# Patient Record
Sex: Female | Born: 1950
Health system: Southern US, Community
[De-identification: ages and names within clinical notes are randomized; demographics above are authoritative.]

## PROBLEM LIST (undated history)

## (undated) DIAGNOSIS — R030 Elevated blood-pressure reading, without diagnosis of hypertension: Secondary | ICD-10-CM

## (undated) DIAGNOSIS — K635 Polyp of colon: Secondary | ICD-10-CM

## (undated) DIAGNOSIS — D229 Melanocytic nevi, unspecified: Secondary | ICD-10-CM

## (undated) DIAGNOSIS — IMO0001 Reserved for inherently not codable concepts without codable children: Secondary | ICD-10-CM

## (undated) HISTORY — DX: Melanocytic nevi, unspecified: D22.9

## (undated) HISTORY — DX: Elevated blood-pressure reading, without diagnosis of hypertension: R03.0

## (undated) HISTORY — PX: TOTAL ABDOMINAL HYSTERECTOMY: SHX209

## (undated) HISTORY — DX: Reserved for inherently not codable concepts without codable children: IMO0001

## (undated) HISTORY — DX: Polyp of colon: K63.5

---

## 2003-02-16 HISTORY — PX: COLONOSCOPY: SHX174

## 2007-06-06 ENCOUNTER — Ambulatory Visit: Payer: Self-pay | Admitting: Family Medicine

## 2007-06-06 DIAGNOSIS — R4589 Other symptoms and signs involving emotional state: Secondary | ICD-10-CM | POA: Insufficient documentation

## 2007-06-07 LAB — CONVERTED CEMR LAB
Alkaline Phosphatase: 57 units/L (ref 39–117)
Basophils Absolute: 0 10*3/uL (ref 0.0–0.1)
Basophils Relative: 0.1 % (ref 0.0–1.0)
Bilirubin, Direct: 0.1 mg/dL (ref 0.0–0.3)
CO2: 31 meq/L (ref 19–32)
Eosinophils Relative: 2 % (ref 0.0–5.0)
GFR calc Af Amer: 112 mL/min
Glucose, Bld: 77 mg/dL (ref 70–99)
LDL Cholesterol: 102 mg/dL — ABNORMAL HIGH (ref 0–99)
Lymphocytes Relative: 20.5 % (ref 12.0–46.0)
Neutrophils Relative %: 72.9 % (ref 43.0–77.0)
Potassium: 4.1 meq/L (ref 3.5–5.1)
RBC: 4.94 M/uL (ref 3.87–5.11)
Sodium: 140 meq/L (ref 135–145)
Total Bilirubin: 0.7 mg/dL (ref 0.3–1.2)
Total Protein: 7.3 g/dL (ref 6.0–8.3)
VLDL: 11 mg/dL (ref 0–40)
WBC: 9.3 10*3/uL (ref 4.5–10.5)

## 2007-06-12 ENCOUNTER — Ambulatory Visit: Payer: Self-pay | Admitting: Family Medicine

## 2007-06-12 LAB — HM COLONOSCOPY

## 2007-06-27 ENCOUNTER — Encounter: Payer: Self-pay | Admitting: Family Medicine

## 2008-08-07 ENCOUNTER — Ambulatory Visit (HOSPITAL_COMMUNITY): Admission: RE | Admit: 2008-08-07 | Discharge: 2008-08-07 | Payer: Self-pay | Admitting: Family Medicine

## 2008-08-07 IMAGING — CT CT PELVIS W/O CM
2 of 4 series · 17 of 46 positions shown, 19 images · non-contrast
Comparison: None

CT ABDOMEN

CLINICAL DATA: Left lower quadrant pain

CT OF THE ABDOMEN AND PELVIS WITHOUT CONTRAST (CT UROGRAM)
TECHNIQUE: Multidetector CT imaging was performed through the
abdomen and pelvis to include the urinary tract.

[Series 2: <(id) stone a/p >(id) · axial · 0.70mm/px · z∈[-438,-33]mm · 14 of 88 slices shown, 16 images]
[im 4/88  soft-tissue]
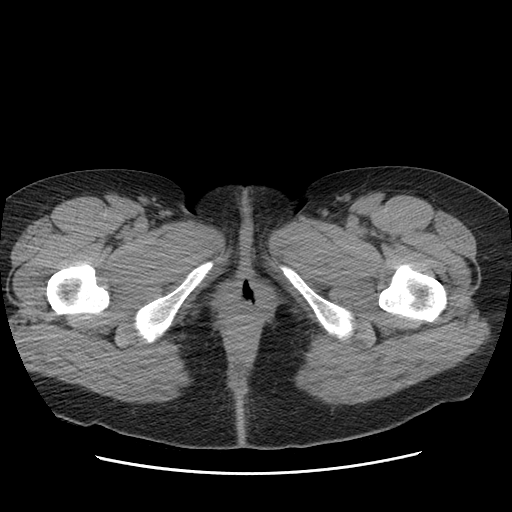
[im 4/88  bone]
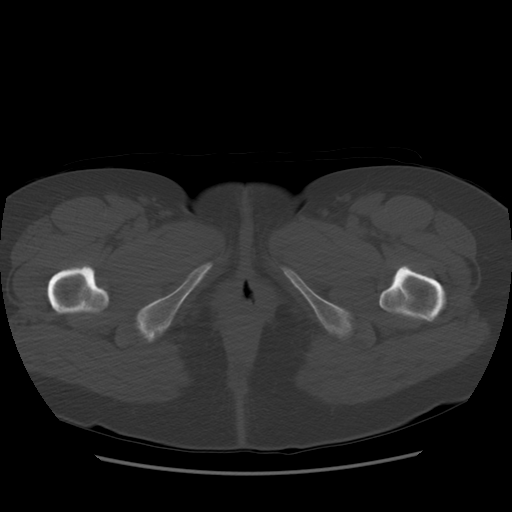
[im 11/88  soft-tissue]
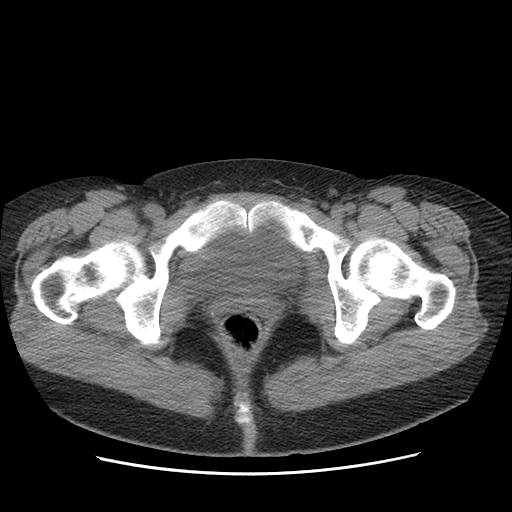
[im 18/88  soft-tissue]
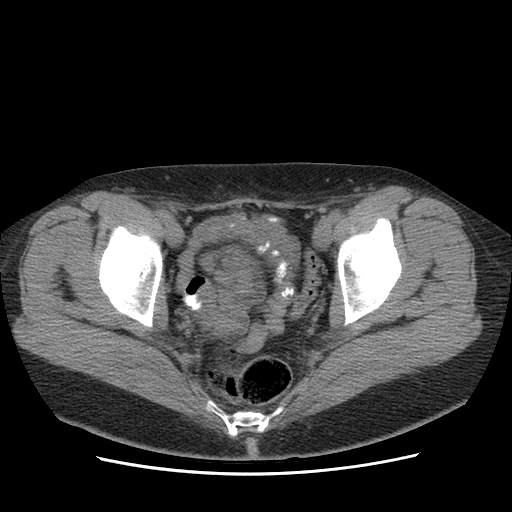
[im 25/88  soft-tissue]
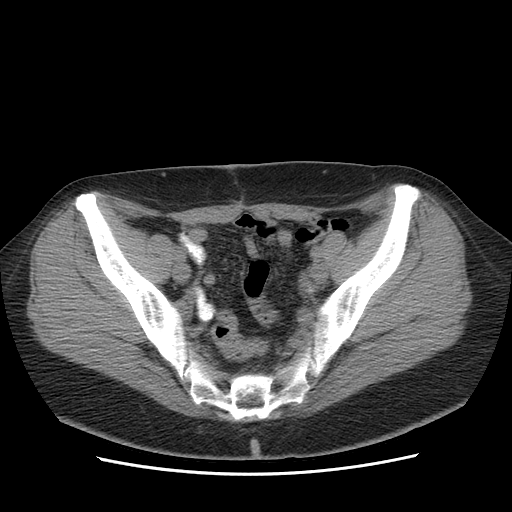
[im 28/88  soft-tissue]
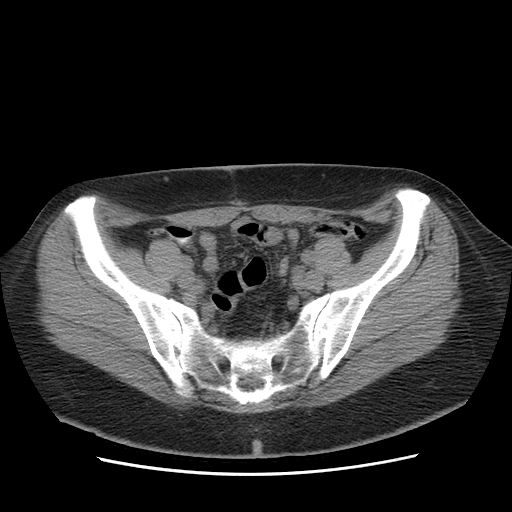
[im 35/88  soft-tissue]
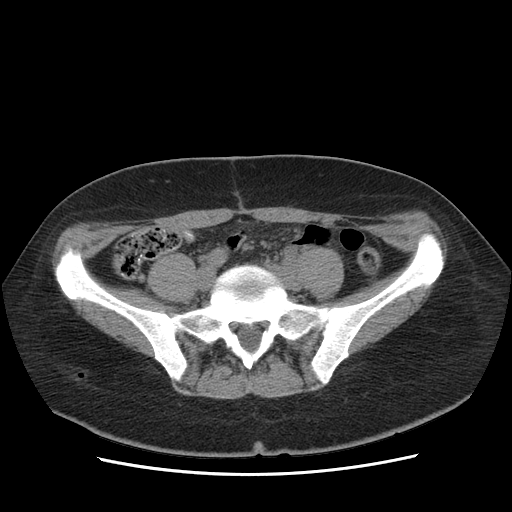
[im 42/88  soft-tissue]
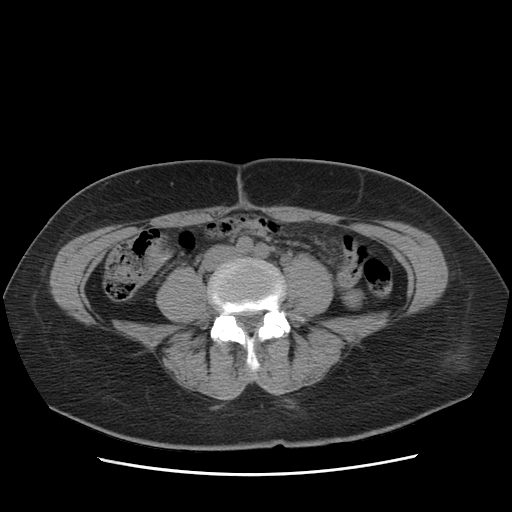
[im 46/88  soft-tissue]
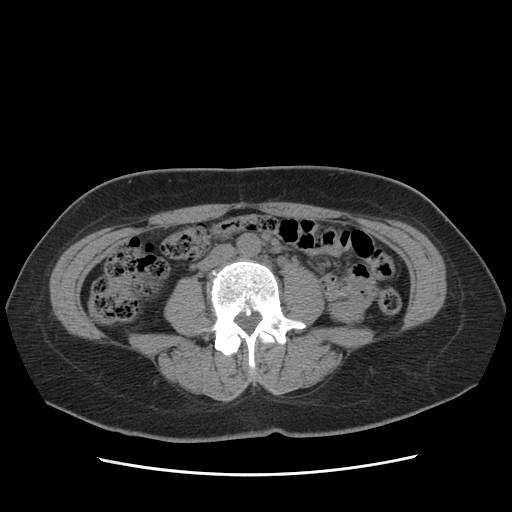
[im 53/88  soft-tissue]
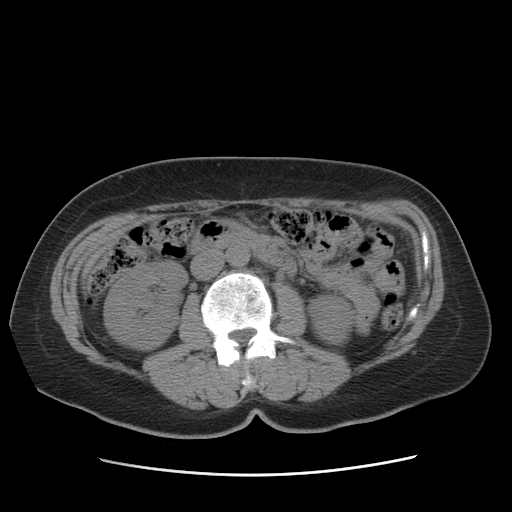
[im 53/88  bone]
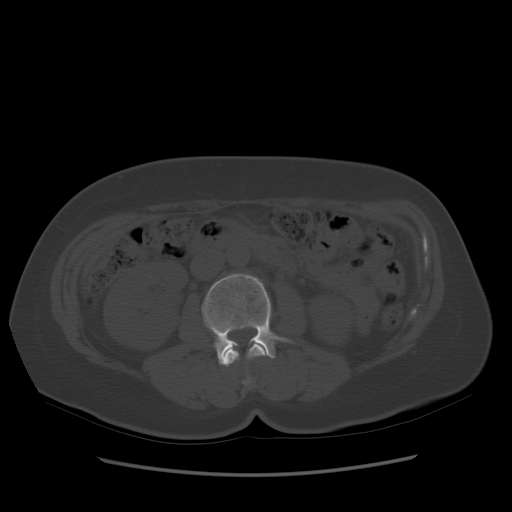
[im 60/88  soft-tissue]
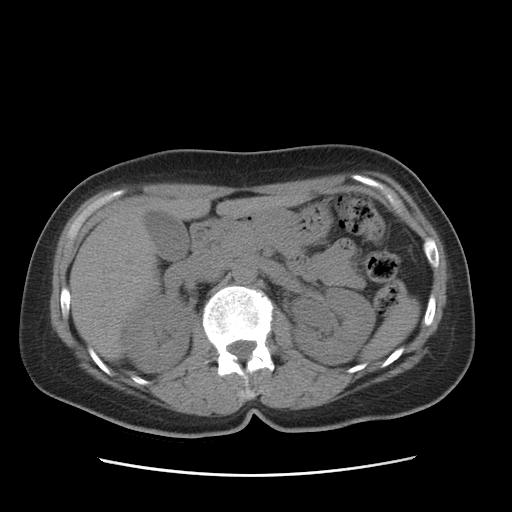
[im 67/88  soft-tissue]
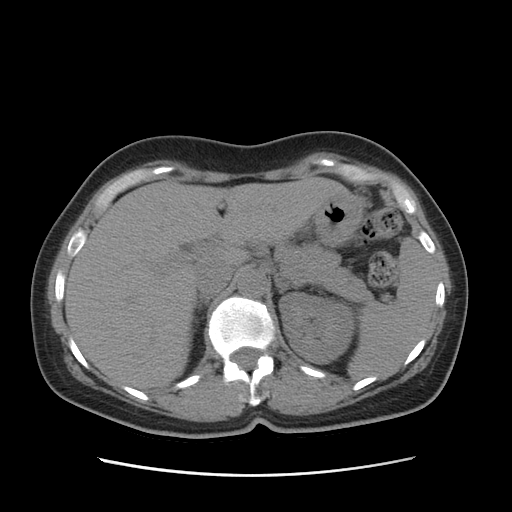
[im 70/88  soft-tissue]
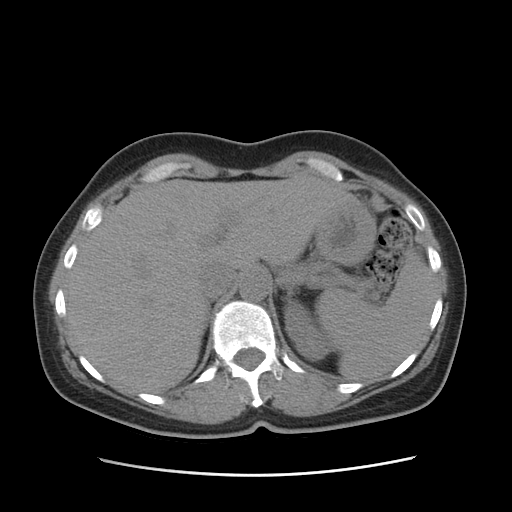
[im 77/88  soft-tissue]
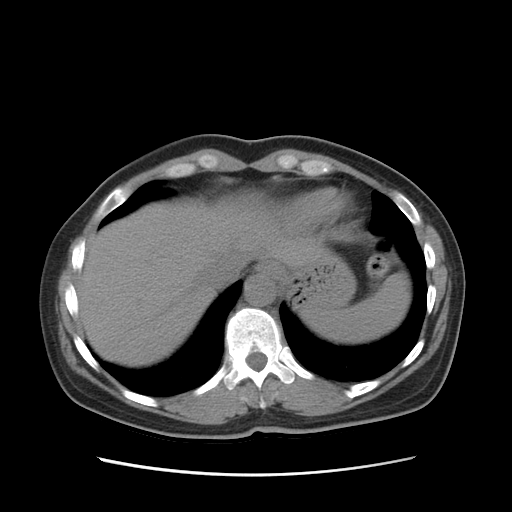
[im 84/88  soft-tissue]
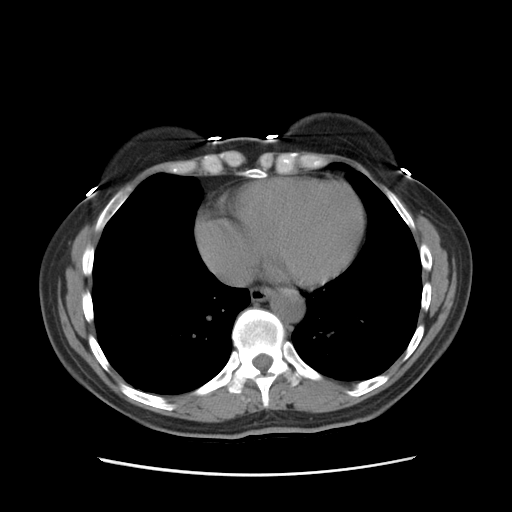

[Series 401: cor · coronal · 0.89mm/px · 3 of 69 slices shown]
[im 23/69  soft-tissue]
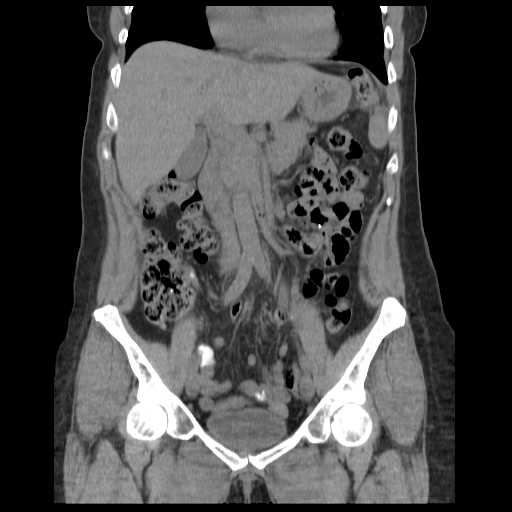
[im 31/69  soft-tissue]
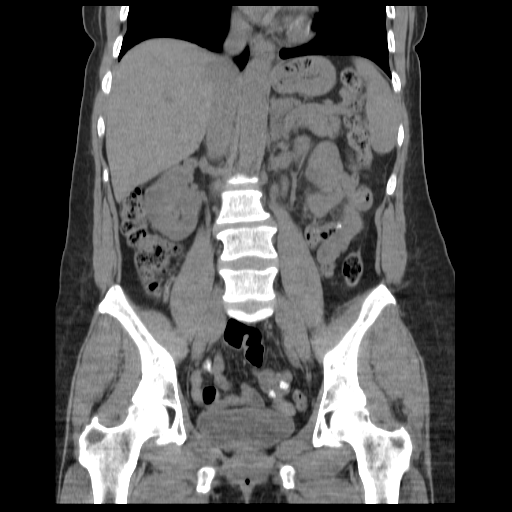
[im 38/69  soft-tissue]
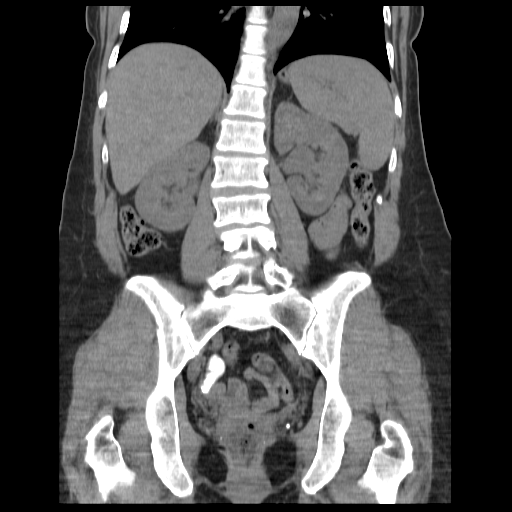

[17 of 46 positions shown; findings below may reference images not displayed]

FINDINGS: Mild left hydronephrosis with minimal left perinephric
stranding.  No nephrolithiasis.  Right kidney is within normal
limits.  Liver, gallbladder, spleen, pancreas, adrenal glands are
grossly within normal limits.
IMPRESSION: Left hydronephrosis.

CT PELVIS
FINDINGS: Distal left ureteral calculus measuring 8 mm in diameter
is 5 cm from the left ureteral vesicle junction.  Bladder is within
normal limits.  No right ureteral calculus.  No free fluid.
Postoperative changes.
IMPRESSION: 8 mm distal left ureteral calculus.

## 2008-08-08 ENCOUNTER — Encounter: Payer: Self-pay | Admitting: Family Medicine

## 2008-08-12 ENCOUNTER — Ambulatory Visit (HOSPITAL_BASED_OUTPATIENT_CLINIC_OR_DEPARTMENT_OTHER): Admission: RE | Admit: 2008-08-12 | Discharge: 2008-08-12 | Payer: Self-pay | Admitting: Urology

## 2008-09-23 ENCOUNTER — Ambulatory Visit: Payer: Self-pay | Admitting: Family Medicine

## 2008-09-25 LAB — CONVERTED CEMR LAB
ALT: 15 units/L (ref 0–35)
Alkaline Phosphatase: 63 units/L (ref 39–117)
BUN: 13 mg/dL (ref 6–23)
Basophils Relative: 1.1 % (ref 0.0–3.0)
Bilirubin, Direct: 0.1 mg/dL (ref 0.0–0.3)
Calcium: 9.1 mg/dL (ref 8.4–10.5)
Cholesterol: 146 mg/dL (ref 0–200)
Creatinine, Ser: 0.8 mg/dL (ref 0.4–1.2)
Eosinophils Relative: 2.7 % (ref 0.0–5.0)
HDL: 44 mg/dL (ref >39.00)
LDL Cholesterol: 82 mg/dL (ref 0–99)
Lymphocytes Relative: 32.2 % (ref 12.0–46.0)
Monocytes Relative: 2.9 % — ABNORMAL LOW (ref 3.0–12.0)
Neutrophils Relative %: 61.1 % (ref 43.0–77.0)
Platelets: 225 10*3/uL (ref 150.0–400.0)
Potassium: 4.3 meq/L (ref 3.5–5.1)
RBC: 4.57 M/uL (ref 3.87–5.11)
Total Bilirubin: 0.7 mg/dL (ref 0.3–1.2)
Total Protein: 6.7 g/dL (ref 6.0–8.3)
VLDL: 20.4 mg/dL (ref 0.0–40.0)
WBC: 7.3 10*3/uL (ref 4.5–10.5)

## 2008-09-27 ENCOUNTER — Encounter: Payer: Self-pay | Admitting: Family Medicine

## 2008-11-12 ENCOUNTER — Encounter: Payer: Self-pay | Admitting: Family Medicine

## 2008-11-21 ENCOUNTER — Encounter (INDEPENDENT_AMBULATORY_CARE_PROVIDER_SITE_OTHER): Payer: Self-pay | Admitting: *Deleted

## 2008-11-21 ENCOUNTER — Encounter: Payer: Self-pay | Admitting: Family Medicine

## 2009-04-03 ENCOUNTER — Ambulatory Visit: Payer: Self-pay | Admitting: Family Medicine

## 2009-05-08 ENCOUNTER — Ambulatory Visit: Payer: Self-pay | Admitting: Family Medicine

## 2009-10-23 ENCOUNTER — Ambulatory Visit: Payer: Self-pay | Admitting: Family Medicine

## 2009-10-23 ENCOUNTER — Telehealth (INDEPENDENT_AMBULATORY_CARE_PROVIDER_SITE_OTHER): Payer: Self-pay | Admitting: *Deleted

## 2009-10-23 LAB — CONVERTED CEMR LAB
ALT: 15 units/L (ref 0–35)
AST: 20 units/L (ref 0–37)
BUN: 12 mg/dL (ref 6–23)
Basophils Relative: 1 % (ref 0.0–3.0)
Bilirubin, Direct: 0.1 mg/dL (ref 0.0–0.3)
Chloride: 106 meq/L (ref 96–112)
Creatinine, Ser: 0.8 mg/dL (ref 0.4–1.2)
Eosinophils Relative: 3.5 % (ref 0.0–5.0)
GFR calc non Af Amer: 75.39 mL/min (ref >60)
HCT: 41.9 % (ref 36.0–46.0)
LDL Cholesterol: 109 mg/dL — ABNORMAL HIGH (ref 0–99)
MCV: 90.5 fL (ref 78.0–100.0)
Monocytes Absolute: 0.4 10*3/uL (ref 0.1–1.0)
Monocytes Relative: 6.6 % (ref 3.0–12.0)
Neutrophils Relative %: 55.3 % (ref 43.0–77.0)
Platelets: 237 10*3/uL (ref 150.0–400.0)
Potassium: 4.7 meq/L (ref 3.5–5.1)
RBC: 4.63 M/uL (ref 3.87–5.11)
Total Bilirubin: 0.7 mg/dL (ref 0.3–1.2)
Total CHOL/HDL Ratio: 3
Total Protein: 6.6 g/dL (ref 6.0–8.3)
VLDL: 9.2 mg/dL (ref 0.0–40.0)
WBC: 5.9 10*3/uL (ref 4.5–10.5)

## 2009-10-27 ENCOUNTER — Ambulatory Visit: Payer: Self-pay | Admitting: Family Medicine

## 2009-10-27 DIAGNOSIS — R51 Headache: Secondary | ICD-10-CM

## 2009-10-27 DIAGNOSIS — I10 Essential (primary) hypertension: Secondary | ICD-10-CM

## 2009-10-27 DIAGNOSIS — R519 Headache, unspecified: Secondary | ICD-10-CM | POA: Insufficient documentation

## 2009-11-17 ENCOUNTER — Encounter: Admission: RE | Admit: 2009-11-17 | Discharge: 2009-11-17 | Payer: Self-pay | Admitting: Family Medicine

## 2009-11-20 ENCOUNTER — Encounter: Payer: Self-pay | Admitting: Family Medicine

## 2009-12-19 ENCOUNTER — Ambulatory Visit: Payer: Self-pay | Admitting: Family Medicine

## 2010-01-07 ENCOUNTER — Telehealth: Payer: Self-pay | Admitting: Family Medicine

## 2010-03-17 NOTE — Progress Notes (Signed)
Summary: atenolol  Phone Note Call from Patient Call back at Home Phone [phone] Call back at [phone] Message from:  Patient on January 07, 2010 9:15 AM  Refills Requested: Medication #1:  ATENOLOL 25 MG TABS 1 by mouth once daily   Last Refilled: 12/19/2009 Patient was given a written script at her follow up, but she says that she lost it and is asking if she can get a new one for her mail order pharmacy.    Method Requested: Pick up at Office Initial call taken by: Melody Comas,  January 07, 2010 9:18 AM  Follow-up for Phone Call        printed in put in nurse in box for pickup  Follow-up by: Judith Part MD,  January 07, 2010 11:23 AM  Additional Follow-up for Phone Call Additional follow up Details #1::        Patient notified as instructed by telephone. Prescription left at front desk. Lewanda Rife LPN  January 07, 2010 12:02 PM     New/Updated Medications: ATENOLOL 25 MG TABS (ATENOLOL) 1 by mouth once daily Prescriptions: ATENOLOL 25 MG TABS (ATENOLOL) 1 by mouth once daily  #90 x 3   Entered and Authorized by:   Judith Part MD   Signed by:   Judith Part MD on 01/07/2010   Method used:   Print then Give to Patient   RxID:   [id number]

## 2010-03-17 NOTE — Letter (Signed)
Summary: Results Follow up Letter  [place] [facility]  [address] [place], [place] [address]   Phone: [phone]  Fax: [phone]    11/20/2009 MRN: [id number]    Jasmine Oliver [address] [place], [place]  [address]    Dear Ms. Fullen,  The following are the results of your recent test(s):  Test         Result    Pap Smear:        Normal _____  Not Normal _____ Comments: ______________________________________________________ Cholesterol: LDL(Bad cholesterol):         Your goal is less than:         HDL (Good cholesterol):       Your goal is more than: Comments:  ______________________________________________________ Mammogram:        Normal __X_  Not Normal _____ Comments:Repeat in one year.   ___________________________________________________________________ Hemoccult:        Normal _____  Not normal _______ Comments:    _____________________________________________________________________ Other Tests:    We routinely do not discuss normal results over the telephone.  If you desire a copy of the results, or you have any questions about this information we can discuss them at your next office visit.   Sincerely,    Idamae Schuller Avy Barlett,MD  MT/ri

## 2010-03-17 NOTE — Progress Notes (Signed)
----   Converted from flag ---- ---- 10/23/2009 8:01 AM, Judith Part MD wrote: please check wellness and lipid v70.0 thanks  ---- 10/22/2009 11:07 AM, Mills Koller wrote: This patient is scheduled for CPX with you, I need lab orders with dx, please. Thanks, Terri ------------------------------

## 2010-03-17 NOTE — Assessment & Plan Note (Signed)
Summary: CPX/CLE   Vital Signs:  Patient profile:   60 year old female Height:      61.5 inches Weight:      178.50 pounds BMI:     33.30 Temp:     97.6 degrees F oral Pulse rate:   68 / minute Pulse rhythm:   regular BP sitting:   140 / 88  (left arm) Cuff size:   regular  Vitals Entered By: Lewanda Rife LPN (October 27, 2009 9:33 AM) CC: CPX LMP complete hyst 2005   History of Present Illness: here for wellness exam and to review chronic med problems is feeling ok overall   came in 6 months ago with headaches that come and go  did not follow up for that  is right in the front - worse when her stress is up  feels at times that bp is up  no migraine symptoms  was wondering if sinus headaches -- but no congestion or ear popping  does not take med often -- if bad tylenol or advil   stress level really high, and does not sleep well  she takes on a lot of work --2 jobs (in transition of delegating , however) wants to retire in 2 more years  little things get to her- is perfectionist  drinks caffine sporatically -- 1 cup of coffee 5 days per week  occ mt dew diet  may not drink enough water    wt is up 9 lb with bmi of 33 (has gained 25 lb back) not exercising regular    140/88 first bp check -- has been up a bit  this may be stress related does not go down when she looses wt  sometimes will go up to 160/90  colonosc 7/06 and had hyperplastic polyp- due check 10 y  tot hyst years ago, nl pap 05 no gyn symptoms at all  no libido since hyst    mam 9/10-- wants to get that set up  self exam   ca and D -- takes it irratically  Td 04  lipids are good Last Lipid ProfileCholesterol: 166 (10/23/2009 8:50:17 AM)HDL:  48.00 (10/23/2009 8:50:17 AM)LDL:  109 (10/23/2009 8:50:17 AM)Triglycerides:  Last Liver profileSGOT:  20 (10/23/2009 8:50:17 AM)SPGT:  15 (10/23/2009 8:50:17 AM)T. Bili:  0.7 (10/23/2009 8:50:17 AM)Alk Phos:  62 (10/23/2009 8:50:17 AM)   got a flu  shot at work this month    Allergies (verified): No Known Drug Allergies  Past History:  Past Surgical History: Last updated: 2007/06/22 hysterectomy- fibroid (large), complete -- 2005  Family History: Last updated: 2007/06/22 father smoker, died of lung cancer, atherosclerosis, ? 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 Eyes:  Denies blurring and eye pain. CV:  Denies chest pain or discomfort, fatigue, and palpitations. Resp:  Denies cough and wheezing. GI:  Denies abdominal pain, change in bowel habits, indigestion, and nausea. GU:  Denies dysuria and nocturia. MS:  Denies muscle aches, cramps, and muscle weakness. Derm:  Denies lesion(s), poor wound healing, and rash.  Neuro:  Complains of headaches; denies numbness, poor balance, seizures, tingling, visual disturbances, and weakness. Psych:  Complains of anxiety; very stressed . Endo:  Denies cold intolerance, excessive thirst, excessive urination, and heat intolerance. Heme:  Denies abnormal bruising and bleeding.  Physical Exam  General:  overweight but generally well appearing  Head:  normocephalic, atraumatic, and no abnormalities observed.   Eyes:  vision grossly intact, pupils equal, pupils round, and pupils reactive to light.  no conjunctival pallor, injection or icterus  Ears:  R ear normal and L ear normal.   Nose:  no nasal discharge.   Mouth:  pharynx pink and moist.   Neck:  supple with full rom and no masses or thyromegally, no JVD or carotid bruit  Chest Wall:   No deformities, masses, or tenderness noted. Breasts:  No mass, nodules, thickening, tenderness, bulging, retraction, inflamation, nipple discharge or skin changes noted.   Lungs:  Normal respiratory effort, chest expands symmetrically. Lungs are clear to auscultation, no crackles or wheezes. Heart:  Normal rate and regular rhythm. S1 and S2 normal without gallop, murmur, click, rub or other extra sounds. Abdomen:  Bowel sounds positive,abdomen soft and non-tender without masses, organomegaly or hernias noted. no renal bruits  Msk:  No deformity or scoliosis noted of thoracic or lumbar spine.  no acute joint changes  Pulses:  R and L carotid,radial,femoral,dorsalis pedis and posterior tibial pulses are full and equal bilaterally Extremities:  No clubbing, cyanosis, edema, or deformity noted with normal full range of motion of all joints.   Neurologic:  alert & oriented X3, cranial nerves II-XII intact, strength normal in all extremities, sensation intact to light touch, gait normal, DTRs symmetrical and normal, toes down bilaterally on Babinski, and Romberg negative.   Skin:  Intact without suspicious lesions or rashes Cervical Nodes:  No lymphadenopathy noted Axillary Nodes:  No palpable lymphadenopathy Inguinal Nodes:  No significant adenopathy Psych:  normal affect, talkative and pleasant    Impression & Recommendations:  Problem # 1:  HEALTH MAINTENANCE EXAM (ICD-V70.0) Assessment Comment Only reviewed health habits including diet, exercise and skin cancer prevention reviewed health maintenance list and family history rev labs in detail - chol is fair- disc low sat fat diet   Problem # 2:  OTHER SCREENING MAMMOGRAM (ICD-V76.12) Assessment: Comment Only annual mammogram scheduled adv pt to continue regular self breast exams non remarkable breast exam today  Orders: Radiology Referral (Radiology)  Problem # 3:  HYPERTENSION, ESSENTIAL NOS (ICD-401.9) Assessment: New  rev last bp  and labs  more headaches lately will try low dose beta blocker for this and also ha watching pulse closely adv to stop med and call if dizzy or low pulse rate (under 50) Her updated medication list for this problem includes:    Atenolol 25 Mg Tabs (Atenolol) .Marland Kitchen... 1 by mouth once daily  Orders: Prescription Created Electronically [phone])  Problem # 4:  HEADACHE (ICD-784.0) Assessment: Deteriorated I feel this is multifactorial with stress / caffine/ poor diet/ lack of exercise and not enough water / and new elevated bp  will start low dose atenolol with short f/u - 4-6 wk update if dizzy or other side eff  Her updated medication list for this problem includes:    Atenolol 25 Mg Tabs (Atenolol) .Marland Kitchen... 1 by mouth once daily  Problem # 5:  STRESS REACTION, ACUTE, WITH EMOTIONAL DISTURBANCE (ICD-308.0) Assessment: Deteriorated this is worse but hopes to imp soon disc counseling in future- she will think about it  adv to start back with exercise disc situational stressors/ symptoms /coping skills/ opt for tx and side eff in detail  Complete Medication List: 1)  Atenolol 25 Mg Tabs (Atenolol) .Marland Kitchen.. 1 by mouth once daily  Patient Instructions: 1)  think about counseling for stress and let me know if you want a referral 2)  get back to exercise and healthy diet 3)  avoid caffine the best you can  4)  the current recommendation for calcium intake is 1200-1500 mg daily with 1000 IU of vitamin D  5)  we will schedule mammogram at check out  6)  follow up with me for blood pressure and headaches in about 4-6 weeks  7)  start atenolol once daily for blood pressure and update me if side effects (like dizziness)  Prescriptions: ATENOLOL 25 MG TABS (ATENOLOL) 1 by mouth once daily  #30 x 11   Entered and Authorized by:   Judith Part MD   Signed by:   Judith Part MD on 10/27/2009   Method used:   Electronically to        [id number]       [address]       [place], [place]  [address]       Ph: [phone] or [phone]       Fax: [phone]   RxID:   [id number]   Prior Medications (reviewed today): None Current Allergies (reviewed today): No known allergies      Influenza Immunization History:    Influenza # 1:  Fluvax 3+ (10/16/2009)

## 2010-03-17 NOTE — Assessment & Plan Note (Signed)
Summary: 6WK F/U FOR BP & HEADACHES / LFW   Vital Signs:  Patient profile:   60 year old female Height:      61.5 inches Weight:      184.50 pounds BMI:     34.42 Temp:     97.8 degrees F oral Pulse rate:   64 / minute Pulse rhythm:   regular BP sitting:   124 / 76  (left arm) Cuff size:   regular  Vitals Entered By: Lewanda Rife LPN (December 19, 2009 8:08 AM) CC: 6 wk f/u for BP and h/a   History of Present Illness: started taking atenolol -- and it seems to have brought her bp down  does not get the headaches she got while she was exercising  headaches are less often  occ subtle headache-- -- does not take anything  lasts most of the day   is tolerating headaches ok - does not want more medicine  occ tingling in her L hand when she exercises ---holds the heartrate sensor on exercise equiptment- it goes to sleep  no cp or sob   not working as much -- just switched companies -- this is going to be a big change   other than that no new stress   is still exercising 4 days per week  next challenge is food control and wt loss      Allergies (verified): No Known Drug Allergies  Past History:  Past Medical History: Last updated: 10/27/2009 colon polyps elevated bp without hypertension  nevus on L rentina-- is watched by opthy   Past Surgical History: Last updated: 2007-06-12 hysterectomy- fibroid (large), complete -- 2005  Family History: Last updated: 2007/06/12 father smoker, died of lung cancer, atherosclerosis, ? 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 ENT:  Denies nasal congestion, ringing in ears, and sinus pressure. CV:   Denies difficulty breathing at night and palpitations. Resp:  Denies cough, shortness of breath, and wheezing. GI:  Denies nausea and vomiting. MS:  Denies joint pain. Derm:  Denies itching, lesion(s), poor wound healing, and rash. Neuro:  Denies numbness and tingling. Psych:  Denies anxiety and depression. Endo:  Denies excessive thirst and excessive urination. Heme:  Denies abnormal bruising and bleeding.  Physical Exam  General:  overweight but generally well appearing  Head:  normocephalic, atraumatic, and no abnormalities observed.   Eyes:  vision grossly intact, pupils equal, pupils round, and pupils reactive to light.  no conjunctival pallor, injection or icterus  Nose:  no nasal discharge.   Mouth:  pharynx pink and moist.   Neck:  supple with full rom and no masses or thyromegally, no JVD or carotid bruit  Chest Wall:  No deformities, masses, or tenderness noted. Lungs:  Normal respiratory effort, chest expands symmetrically. Lungs are clear to auscultation, no crackles or wheezes. Heart:  Normal rate and regular rhythm. S1 and S2 normal without gallop, murmur, click, rub or other extra sounds. Abdomen:  Bowel sounds positive,abdomen soft and non-tender without masses, organomegaly or hernias noted. no renal bruits  Extremities:  No clubbing, cyanosis, edema, or deformity noted with normal full range of motion of  all joints.   Neurologic:  cranial nerves II-XII intact, sensation intact to light touch, gait normal, and DTRs symmetrical and normal.   Skin:  Intact without suspicious lesions or rashes Cervical Nodes:  No lymphadenopathy noted Psych:  normal affect, talkative and pleasant    Impression & Recommendations:  Problem # 1:  HYPERTENSION, ESSENTIAL NOS (ICD-401.9) Assessment Improved  this is well controlled with small dose of atenolol will continue this and f/u 6 mo  Her updated medication list for this problem includes:    Atenolol 25 Mg Tabs (Atenolol) .Marland Kitchen... 1  by mouth once daily  BP today: 124/76 Prior BP: 140/88 (10/27/2009)  Labs Reviewed: K+: 4.7 (10/23/2009) Creat: : 0.8 (10/23/2009)   Chol: 166 (10/23/2009)   HDL: 48.00 (10/23/2009)   LDL: 109 (10/23/2009)   TG: 46.0 (10/23/2009)  Problem # 2:  HEADACHE (ICD-784.0) Assessment: Improved this is significantly improved with low dose beta blocker  no changes otherwise  pt does not want more med for headache at this time  will not inc dose due to pulse of 64 disc lifestyle change for headache as well f/u 6 mo  could consider neurontin or tricyclic at bedtime if needed for addn ha control  no other neurol changes Her updated medication list for this problem includes:    Atenolol 25 Mg Tabs (Atenolol) .Marland Kitchen... 1 by mouth once daily  Complete Medication List: 1)  Atenolol 25 Mg Tabs (Atenolol) .Marland Kitchen.. 1 by mouth once daily 2)  Vitamin C & D Combo 500mg   .... Take 1 tablet by mouth once a day  Patient Instructions: 1)  the current recommendation for calcium intake is 1200-1500 mg daily with 1000 IU of vitamin D  2)  keep up the great exercise -- this is good for you  3)  start working on healthy diet  4)  get regular sleep and avoid caffiene 5)  if headaches worsen or change or if they do not continue to improve please let me know 6)  follow up in about 6 months  Prescriptions: ATENOLOL 25 MG TABS (ATENOLOL) 1 by mouth once daily  #90 x 3   Entered and Authorized by:   Judith Part MD   Signed by:   Judith Part MD on 12/19/2009   Method used:   Print then Give to Patient   RxID:   [id number]    Orders Added: 1)  Est. Patient Level III [14782]    Current Allergies (reviewed today): No known allergies

## 2010-03-17 NOTE — Assessment & Plan Note (Signed)
Summary: HEADACHES EVERYDAY X 6 WKS CYD   Vital Signs:  Patient profile:   60 year old female Weight:      169 pounds Temp:     98.5 degrees F oral Pulse rate:   64 / minute Pulse rhythm:   regular BP sitting:   124 / 90  (left arm) Cuff size:   regular  Vitals Entered By: Sydell Axon LPN (April 03, 2009 9:00 AM) CC: Headaches off and on for several months   History of Present Illness: Pt had bad headache yesterday in the front of the head but thinks this startred working out at [employer] with high heart rate...no congestion. She has no prob with allergies or head colds. Headache yesterday started Tues with discomf thru the noight, awake a lot with it. Doesn't talke aspirin to relieve it.    Pain typically 6/10 at the severest, 0/10 or 1-2/10 most of the time. She cannot make worse that she knows of...works out once every 2 weeks and thinks this may affect. No warning, no visual changes, no aromas, no muscle tightness of which she is aware, she admits to lots of tension.  Problems Prior to Update: 1)  Other Screening Mammogram  (ICD-V76.12) 2)  Screening For Malignant Neoplasm of The Skin  (ICD-V76.43) 3)  Health Maintenance Exam  (ICD-V70.0) 4)  Stress Reaction, Acute, With Emotional Disturbance  (ICD-308.0) 5)  Memory Loss  (ICD-780.93) 6)  Elevated Blood Pressure Without Diagnosis of Hypertension  (ICD-796.2)  Medications Prior to Update: 1)  None  Allergies: No Known Drug Allergies  Physical Exam  General:  Well-developed,well-nourished,in no acute distress; alert,appropriate and cooperative throughout examination Head:  normocephalic, atraumatic, and no abnormalities observed.  Sinuses NT throughout. Eyes:  Conjunctiva clear bilaterally.  Ears:  R ear normal and L ear normal.   Nose:  no nasal discharge.   Mouth:  pharynx pink and moist.   Neck:  supple with full rom and no masses or thyromegally, no JVD or carotid bruit  Chest Wall:  No deformities, masses, or  tenderness noted. Lungs:  Normal respiratory effort, chest expands symmetrically. Lungs are clear to auscultation, no crackles or wheezes. Heart:  Normal rate and regular rhythm. S1 and S2 normal without gallop, murmur, click, rub or other extra sounds. Neurologic:  sensation intact to light touch, gait normal, and DTRs symmetrical and normal.     Impression & Recommendations:  Problem # 1:  HEADACHE, FRONTAL (ICD-784.0) Assessment New  HAs been going on for months...will treat initially as congestive type altho exam belies that. Keep detailed diary in the meanrtime.  RTC one month with diary. Will hold off triptans.  See instructions.  Headache diary reviewed.  Patient Instructions: 1)  Keep diary of headaches, when has them and intensity, sleep patterns, location of headache, intake of all foods and fluids, activity....Marland Kitchenfor one month. 2)  Take Guaifenesin by [facility] and getting MUCOUS RELIEF EXPECTORANT (400mg ), take 1tabs by mouth AM for one month. 3)  Drink lots of fluids anytime taking Guaifenesin.  4)  Take TYL ES 2 tabs every AM and if headache begins (max 2 three times a day)  Prior Medications (reviewed today): None Current Allergies (reviewed today): No known allergies

## 2010-04-25 ENCOUNTER — Encounter: Payer: Self-pay | Admitting: Family Medicine

## 2010-06-22 ENCOUNTER — Ambulatory Visit (INDEPENDENT_AMBULATORY_CARE_PROVIDER_SITE_OTHER): Payer: Private Health Insurance - Indemnity | Admitting: Family Medicine

## 2010-06-22 ENCOUNTER — Encounter: Payer: Self-pay | Admitting: Family Medicine

## 2010-06-22 DIAGNOSIS — I10 Essential (primary) hypertension: Secondary | ICD-10-CM

## 2010-06-22 DIAGNOSIS — J069 Acute upper respiratory infection, unspecified: Secondary | ICD-10-CM

## 2010-06-22 DIAGNOSIS — R51 Headache: Secondary | ICD-10-CM

## 2010-06-22 DIAGNOSIS — R4589 Other symptoms and signs involving emotional state: Secondary | ICD-10-CM

## 2010-06-22 MED ORDER — ATENOLOL 25 MG PO TABS: 25.0000 mg | ORAL_TABLET | Freq: Every day | ORAL | Status: DC

## 2010-06-22 NOTE — Assessment & Plan Note (Signed)
Problems sleeping due to situational stress and anxiety Ref to counseling

## 2010-06-22 NOTE — Assessment & Plan Note (Signed)
No more since starting atenolol Continue this without change

## 2010-06-22 NOTE — Assessment & Plan Note (Signed)
Good control with atenolol  No change Disc lifestyle habits F/u 6 mo for PE

## 2010-06-22 NOTE — Patient Instructions (Signed)
No change in medicines For cold - can try zinc losenges for first 3-4 days  Lots of fluids  Antihistamine like zyrtec may be helpful If throat gets worse or fever- let know  Schedule follow up in 6 months for PE with labs prior  We will do stress/ counseling referral at check out

## 2010-06-22 NOTE — Progress Notes (Signed)
  Subjective:    Patient ID: Jasmine Oliver, female    DOB: 05-16-1950, 60 y.o.   MRN: [id number]  HPI here for f/u of HTN and headaches , also for ST bp is still good on atenolol at 25 mg  Headaches No headaches for a while   No cp or edema  Pulse stable at 64  Wt down 5 lb  When she checks it is 140s systolic -- is reading a bit high   Has a sore throat - has -- over a week No fever Feels like she is getting a cold  Some post nasal drip and earache Little cough   Much stress lately - dealing with family issues  This is keeping her from sleeping Has tried otc meds Wants to avoid px meds for sleep or anx  Would be open to counseling    Review of Systems  Past Medical History  Diagnosis Date  . Colon polyps   . Elevated BP     without hypertension  . Nevus     on left retina-- is watched by opthy    History   Social History  . Marital Status: Married    Spouse Name: N/A    Number of Children: 4  . Years of Education: N/A   Occupational History  . Operations manager    Social History Main Topics  . Smoking status: Never Smoker   . Smokeless tobacco: Not on file  . Alcohol Use: Not on file  . Drug Use: Yes     occasionally drinks wine  . Sexually Active: Not on file   Other Topics Concern  . Not on file   Social History Narrative   Plays Golf and goes to the gym        Objective:   Physical Exam  Constitutional: She appears well-developed and well-nourished. No distress.       overwt and well appearing   HENT:  Head: Normocephalic and atraumatic.  Right Ear: External ear normal.  Left Ear: External ear normal.  Mouth/Throat: Oropharynx is clear and moist.       Nares are boggy and congested bilat Post nasal drip noted  No sinus tenderness  Eyes: Conjunctivae and EOM are normal. Pupils are equal, round, and reactive to light.  Neck: Normal range of motion. Neck supple. No JVD present. Carotid bruit is not present. No thyromegaly present.    Cardiovascular: Normal rate, regular rhythm and normal heart sounds.   Pulmonary/Chest: Effort normal and breath sounds normal. No respiratory distress. She has no wheezes.  Abdominal: Soft. Bowel sounds are normal. She exhibits no distension and no mass.  Musculoskeletal: Normal range of motion. She exhibits no edema and no tenderness.  Lymphadenopathy:    She has no cervical adenopathy.  Neurological: She is alert. She has normal reflexes.  Skin: Skin is warm and dry. No rash noted. No erythema. No pallor.  Psychiatric:       Seems stressed Almost tearful at times and generally fatigued  Nl eye contact and comm skills          Assessment & Plan:

## 2010-06-25 DIAGNOSIS — J069 Acute upper respiratory infection, unspecified: Secondary | ICD-10-CM | POA: Insufficient documentation

## 2010-06-25 NOTE — Assessment & Plan Note (Signed)
Mild without complication Symptomatic care discussed Update if worse or fever

## 2010-06-30 NOTE — Op Note (Signed)
Jasmine Oliver, Jasmine Oliver                 ACCOUNT NO.:  [id number]   MEDICAL RECORD NO.:  [id number]          PATIENT TYPE:  AMB   LOCATION:  [facility]                         FACILITY:  [facility]   PHYSICIAN:  Ronald L. Earlene Plater, M.D.  DATE OF BIRTH:  05-30-1950   DATE OF PROCEDURE:  08/12/2008  DATE OF DISCHARGE:                               OPERATIVE REPORT   DIAGNOSIS:  Left ureterolithiasis with mild hydronephrosis.   OPERATIVE PROCEDURE:  Cystourethroscopy, left ureteroscopy with holmium  laser lithotripsy, left retrograde pyelogram, basket stone extraction  and placement of left double-J stent.   SURGEON:  Gaynelle Arabian, M.D.   ANESTHESIA:  LMA.   BLOOD LOSS:  Negligible.   TUBES:  24 cm 6-French contoured double pigtail stent.   COMPLICATIONS:  None.   INDICATIONS FOR PROCEDURE:  Jasmine Oliver is a lovely 60 year old white  female who presented with left flank pain, nausea and vomiting.  CT scan  of the abdomen and pelvis revealed an 8-mm, partially obstructive left  distal ureteral stone.  She notes her symptoms have resolved but the  stone has not moved.  After understanding risks, benefits and  alternatives, she elected proceed with the above procedure.   PROCEDURE IN DETAIL:  The patient was placed in supine position.  After  proper LMA anesthesia, was placed in the dorsal lithotomy position and  prepped and draped with Betadine in sterile fashion.  Cystourethroscopy  was performed with a 22.5 [place] Olympus panendoscope.  Utilizing the 12  and 70 degrees lenses, the bladder was carefully inspected and noted to  be without lesions.  Both ureteral orifices were normal in location.  Under fluoroscopic guidance a 0.038 French Sensor wire was placed into  the left renal pelvis beyond the stone which could be seen on  fluoroscopy and the distal ureter was dilated with inner dilating sheath  of a ureteral access catheter.  Ureteroscopy was then performed with a  short thin semiflexible  ureteroscope.  The stone was visualized.  It  quite large and appeared to be smooth and yellowish in color.  Utilizing  the 365 micron laser fiber on a setting of 0.6 and repetition rate of 6,  the stone was subsequent fragmented into multiple small fragments and  each was basket extracted with a nitinol basket.  Reinspection of the  lower 2/3 ureter revealed there were no further fragments.  There was  some hydronephrosis above the stone but again no other lesions were  noted.  The ureteroscope was visually removed.  A 6-French open-ended  catheter was placed in the left renal pelvis over the wire and a  retrograde pyelogram was injected.  There was mild hydroureteronephrosis  noted but no extravasation.  The wire was replaced  and under fluoroscopic guidance a 24-cm 6-French contour double pigtail  stent without a string was placed and noted to be in good position  within the left renal pelvis and within the bladder.  The bladder was  drained, panendoscope was removed, stones will be submitted for stone  analysis.      Ronald L. Earlene Plater, M.D.  Electronically Signed     RLD/MEDQ  D:  08/12/2008  T:  08/12/2008  Job:  [id number]

## 2010-10-22 ENCOUNTER — Ambulatory Visit (INDEPENDENT_AMBULATORY_CARE_PROVIDER_SITE_OTHER): Payer: Private Health Insurance - Indemnity | Admitting: Family Medicine

## 2010-10-22 ENCOUNTER — Encounter: Payer: Self-pay | Admitting: Family Medicine

## 2010-10-22 DIAGNOSIS — H9209 Otalgia, unspecified ear: Secondary | ICD-10-CM

## 2010-10-22 DIAGNOSIS — H9201 Otalgia, right ear: Secondary | ICD-10-CM | POA: Insufficient documentation

## 2010-10-22 NOTE — Patient Instructions (Signed)
Start ibuprofen 800 mg TID for next few days.  Limit chewing tough things, gum etc.  Consider mouth guard for grinding.  Follow up if not improving.

## 2010-10-22 NOTE — Assessment & Plan Note (Signed)
No sign of infection or eustacian tube dysfunction. Most consistent with TMJ or muscle tension in head and jaw.  Statr with NSAIDs, avoid TMJ triggers. If not improving consider further evaluation.

## 2010-10-22 NOTE — Progress Notes (Signed)
  Subjective:    Patient ID: Jasmine Oliver, female    DOB: [birth year], 60 y.o.   MRN: [id number]  Otalgia  There is pain in the right ear. This is a new problem. The current episode started in the past 7 days. The problem occurs every few minutes. The problem has been gradually worsening (pain in ear as well as behind ear). There has been no fever. The pain is at a severity of 8/10. Pertinent negatives include no coughing, drainage, headaches, hearing loss, neck pain, rash, rhinorrhea, sore throat or vomiting. Associated symptoms comments: Mild post nasal drip. She has tried acetaminophen (benadryl) for the symptoms. The treatment provided mild relief. There is no history of a chronic ear infection or hearing loss. has had similar ear pain in past, but exam has been normal, goes away on its own    Grinding teeth per dentist. Does not have mouth guard.  Review of Systems  HENT: Positive for ear pain. Negative for hearing loss, sore throat, rhinorrhea and neck pain.   Respiratory: Negative for cough.   Gastrointestinal: Negative for vomiting.  Skin: Negative for rash.  Neurological: Negative for headaches.       Objective:   Physical Exam  Constitutional: She appears well-developed and well-nourished.  HENT:  Head: Normocephalic.  Right Ear: External ear normal. No foreign bodies. Tympanic membrane is not scarred, not perforated and not erythematous. No middle ear effusion.  Left Ear: External ear normal. No foreign bodies. Tympanic membrane is not scarred, not perforated and not erythematous.  No middle ear effusion.  Nose: Nose normal.  Mouth/Throat: Oropharynx is clear and moist. No oropharyngeal exudate.       ttp over TMJ joint, some pain with moving jaw, no popping  Eyes: Conjunctivae and EOM are normal. Pupils are equal, round, and reactive to light. Right eye exhibits no discharge. Left eye exhibits no discharge.  Neck: Normal range of motion. Neck supple.       ttp in occiput    Cardiovascular: Normal rate, regular rhythm, normal heart sounds and intact distal pulses.  Exam reveals no gallop and no friction rub.   No murmur heard. Pulmonary/Chest: Effort normal and breath sounds normal.          Assessment & Plan:

## 2010-12-17 ENCOUNTER — Telehealth: Payer: Self-pay | Admitting: Family Medicine

## 2010-12-17 DIAGNOSIS — I10 Essential (primary) hypertension: Secondary | ICD-10-CM

## 2010-12-17 DIAGNOSIS — Z0000 Encounter for general adult medical examination without abnormal findings: Secondary | ICD-10-CM | POA: Insufficient documentation

## 2010-12-17 NOTE — Telephone Encounter (Signed)
Message copied by Judy Pimple on Thu Dec 17, 2010  8:53 PM ------      Message from: Baldomero Lamy      Created: Thu Dec 17, 2010  9:37 AM      Regarding: Cpx labs Fri 12/18/10       Please order  future cpx labs for pt's upcomming lab appt.      Thanks      Rodney Booze

## 2010-12-18 ENCOUNTER — Other Ambulatory Visit: Payer: Private Health Insurance - Indemnity

## 2010-12-22 ENCOUNTER — Other Ambulatory Visit: Payer: Private Health Insurance - Indemnity

## 2010-12-23 ENCOUNTER — Encounter: Payer: Private Health Insurance - Indemnity | Admitting: Family Medicine

## 2011-02-18 ENCOUNTER — Other Ambulatory Visit: Payer: Self-pay | Admitting: Family Medicine

## 2011-02-18 DIAGNOSIS — Z1231 Encounter for screening mammogram for malignant neoplasm of breast: Secondary | ICD-10-CM

## 2011-02-25 ENCOUNTER — Ambulatory Visit
Admission: RE | Admit: 2011-02-25 | Discharge: 2011-02-25 | Disposition: A | Discharge status: Home / Self Care | Payer: Private Health Insurance - Indemnity | Source: Ambulatory Visit | Attending: Family Medicine | Admitting: Family Medicine

## 2011-02-25 DIAGNOSIS — Z1231 Encounter for screening mammogram for malignant neoplasm of breast: Secondary | ICD-10-CM

## 2011-03-03 ENCOUNTER — Encounter: Payer: Self-pay | Admitting: *Deleted

## 2011-04-06 ENCOUNTER — Other Ambulatory Visit: Payer: Private Health Insurance - Indemnity

## 2011-04-12 ENCOUNTER — Other Ambulatory Visit (INDEPENDENT_AMBULATORY_CARE_PROVIDER_SITE_OTHER): Payer: Private Health Insurance - Indemnity | Admitting: Family Medicine

## 2011-04-12 DIAGNOSIS — I10 Essential (primary) hypertension: Secondary | ICD-10-CM

## 2011-04-12 DIAGNOSIS — Z0000 Encounter for general adult medical examination without abnormal findings: Secondary | ICD-10-CM

## 2011-04-12 LAB — LIPID PANEL
HDL: 54.1 mg/dL (ref >39.00)
Total CHOL/HDL Ratio: 3
Triglycerides: 78 mg/dL (ref 0.0–149.0)
VLDL: 15.6 mg/dL (ref 0.0–40.0)

## 2011-04-12 LAB — CBC WITH DIFFERENTIAL/PLATELET
Basophils Absolute: 0 10*3/uL (ref 0.0–0.1)
Basophils Relative: 0.6 % (ref 0.0–3.0)
Eosinophils Absolute: 0.2 10*3/uL (ref 0.0–0.7)
HCT: 44.2 % (ref 36.0–46.0)
Hemoglobin: 14.6 g/dL (ref 12.0–15.0)
Lymphocytes Relative: 30.9 % (ref 12.0–46.0)
Lymphs Abs: 2.6 10*3/uL (ref 0.7–4.0)
MCHC: 33.1 g/dL (ref 30.0–36.0)
MCV: 89.8 fl (ref 78.0–100.0)
Monocytes Absolute: 0.5 10*3/uL (ref 0.1–1.0)
Neutro Abs: 5 10*3/uL (ref 1.4–7.7)
RBC: 4.92 Mil/uL (ref 3.87–5.11)
RDW: 13.7 % (ref 11.5–14.6)

## 2011-04-12 LAB — COMPREHENSIVE METABOLIC PANEL
ALT: 18 U/L (ref 0–35)
AST: 19 U/L (ref 0–37)
BUN: 18 mg/dL (ref 6–23)
Calcium: 9.5 mg/dL (ref 8.4–10.5)
Chloride: 108 mEq/L (ref 96–112)
Creatinine, Ser: 0.9 mg/dL (ref 0.4–1.2)
Total Bilirubin: 0.4 mg/dL (ref 0.3–1.2)

## 2011-04-13 ENCOUNTER — Encounter: Payer: Self-pay | Admitting: Family Medicine

## 2011-04-13 ENCOUNTER — Ambulatory Visit (INDEPENDENT_AMBULATORY_CARE_PROVIDER_SITE_OTHER): Payer: Private Health Insurance - Indemnity | Admitting: Family Medicine

## 2011-04-13 VITALS — BP 138/78 | HR 56 | Temp 97.5°F | Ht 62.0 in | Wt 189.5 lb

## 2011-04-13 DIAGNOSIS — E669 Obesity, unspecified: Secondary | ICD-10-CM | POA: Insufficient documentation

## 2011-04-13 DIAGNOSIS — Z0000 Encounter for general adult medical examination without abnormal findings: Secondary | ICD-10-CM

## 2011-04-13 DIAGNOSIS — Z86010 Personal history of colonic polyps: Secondary | ICD-10-CM

## 2011-04-13 DIAGNOSIS — R4589 Other symptoms and signs involving emotional state: Secondary | ICD-10-CM

## 2011-04-13 DIAGNOSIS — I10 Essential (primary) hypertension: Secondary | ICD-10-CM

## 2011-04-13 MED ORDER — ATENOLOL 25 MG PO TABS: 25.0000 mg | ORAL_TABLET | Freq: Every day | ORAL | Status: DC

## 2011-04-13 NOTE — Assessment & Plan Note (Signed)
bp in fair control at this time  No changes needed  Disc lifstyle change with low sodium diet and exercise   Will get new cuff for home size large Rev labs in detail

## 2011-04-13 NOTE — Assessment & Plan Note (Signed)
Reviewed health habits including diet and exercise and skin cancer prevention Also reviewed health mt list, fam hx and immunizations  Will get flu shot next season Rev wellness labs and need for wt loss
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
## 2011-04-13 NOTE — Assessment & Plan Note (Signed)
Due for colonoscopy for colon polyp f/u from 2010 (per pt) No bowel changes Ref to GI

## 2011-04-13 NOTE — Patient Instructions (Signed)
We will do counseling referral at check out  We will do GI referral for colonoscopy at check out Get OMRON bp cuff size large for blood pressure Avoid red meat/ fried foods/ egg yolks/ fatty breakfast meats/ butter, cheese and high fat dairy/ and shellfish  Aim for 5 days of exercise per week at least 30 minutes for wt loss Stick with weight watchers

## 2011-04-13 NOTE — Assessment & Plan Note (Signed)
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

## 2011-04-13 NOTE — Assessment & Plan Note (Signed)
Discussed how this problem influences overall health and the risks it imposes  Reviewed plan for weight loss with lower calorie diet (via better food choices and also portion control or program like weight watchers) and exercise building up to or more than 30 minutes 5 days per week including some aerobic activity   Will stick with wt watchers

## 2011-04-27 ENCOUNTER — Ambulatory Visit (INDEPENDENT_AMBULATORY_CARE_PROVIDER_SITE_OTHER): Payer: Private Health Insurance - Indemnity | Admitting: Psychology

## 2011-04-27 DIAGNOSIS — F4323 Adjustment disorder with mixed anxiety and depressed mood: Secondary | ICD-10-CM

## 2011-05-19 ENCOUNTER — Ambulatory Visit: Payer: Private Health Insurance - Indemnity | Admitting: Psychology

## 2011-05-25 ENCOUNTER — Ambulatory Visit: Payer: Private Health Insurance - Indemnity | Admitting: Psychology

## 2011-06-09 ENCOUNTER — Ambulatory Visit (INDEPENDENT_AMBULATORY_CARE_PROVIDER_SITE_OTHER): Payer: Private Health Insurance - Indemnity | Admitting: Psychology

## 2011-06-09 DIAGNOSIS — F4323 Adjustment disorder with mixed anxiety and depressed mood: Secondary | ICD-10-CM

## 2011-07-06 ENCOUNTER — Ambulatory Visit (INDEPENDENT_AMBULATORY_CARE_PROVIDER_SITE_OTHER): Payer: Private Health Insurance - Indemnity | Admitting: Psychology

## 2011-07-06 DIAGNOSIS — F4323 Adjustment disorder with mixed anxiety and depressed mood: Secondary | ICD-10-CM

## 2011-07-28 ENCOUNTER — Ambulatory Visit (INDEPENDENT_AMBULATORY_CARE_PROVIDER_SITE_OTHER): Payer: Private Health Insurance - Indemnity | Admitting: Psychology

## 2011-07-28 DIAGNOSIS — F4323 Adjustment disorder with mixed anxiety and depressed mood: Secondary | ICD-10-CM

## 2011-10-20 ENCOUNTER — Other Ambulatory Visit: Payer: Self-pay | Admitting: Family Medicine

## 2012-03-07 ENCOUNTER — Other Ambulatory Visit: Payer: Self-pay | Admitting: Family Medicine

## 2012-03-07 DIAGNOSIS — Z1231 Encounter for screening mammogram for malignant neoplasm of breast: Secondary | ICD-10-CM

## 2012-03-10 ENCOUNTER — Encounter: Payer: Self-pay | Admitting: Family Medicine

## 2012-03-10 ENCOUNTER — Ambulatory Visit (INDEPENDENT_AMBULATORY_CARE_PROVIDER_SITE_OTHER): Payer: Private Health Insurance - Indemnity | Admitting: Family Medicine

## 2012-03-10 VITALS — BP 114/64 | HR 62 | Temp 98.4°F | Ht 62.0 in | Wt 179.0 lb

## 2012-03-10 DIAGNOSIS — T2120XA Burn of second degree of trunk, unspecified site, initial encounter: Secondary | ICD-10-CM

## 2012-03-10 DIAGNOSIS — Z23 Encounter for immunization: Secondary | ICD-10-CM

## 2012-03-10 MED ORDER — SILVER SULFADIAZINE 1 % EX CREA: TOPICAL_CREAM | Freq: Every day | CUTANEOUS | Status: DC

## 2012-03-10 NOTE — Patient Instructions (Addendum)
Keep burn clean with gentle soap and water  Do not submerge  Use silvadene cream with sterile applicator and non stick gauze once daily Paper tape applied gently If worse / pain / fever - call and let me know  Tdap and flu shots today

## 2012-03-10 NOTE — Assessment & Plan Note (Signed)
On rectangular area - vertical on abdomen - with erythema/ nl sensation and several denuded areas No s/s of infection Dressed sterilly with silvadene cream and non stick bandage and paper tape  inst how to dress  Update if no imp in next several weeks or worse or fever Tdap today
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
## 2012-03-13 ENCOUNTER — Other Ambulatory Visit: Payer: Self-pay | Admitting: Family Medicine

## 2012-03-13 ENCOUNTER — Ambulatory Visit
Admission: RE | Admit: 2012-03-13 | Discharge: 2012-03-13 | Disposition: A | Discharge status: Home / Self Care | Payer: Private Health Insurance - Indemnity | Source: Ambulatory Visit | Attending: Family Medicine | Admitting: Family Medicine

## 2012-03-13 DIAGNOSIS — Z1231 Encounter for screening mammogram for malignant neoplasm of breast: Secondary | ICD-10-CM

## 2012-03-13 DIAGNOSIS — R928 Other abnormal and inconclusive findings on diagnostic imaging of breast: Secondary | ICD-10-CM

## 2012-03-23 ENCOUNTER — Ambulatory Visit
Admission: RE | Admit: 2012-03-23 | Discharge: 2012-03-23 | Disposition: A | Discharge status: Home / Self Care | Payer: Private Health Insurance - Indemnity | Source: Ambulatory Visit | Attending: Family Medicine | Admitting: Family Medicine

## 2012-03-23 DIAGNOSIS — R928 Other abnormal and inconclusive findings on diagnostic imaging of breast: Secondary | ICD-10-CM

## 2012-03-24 ENCOUNTER — Encounter: Payer: Self-pay | Admitting: *Deleted

## 2012-05-01 ENCOUNTER — Other Ambulatory Visit: Payer: Self-pay | Admitting: Family Medicine

## 2012-07-07 ENCOUNTER — Telehealth: Payer: Self-pay | Admitting: Family Medicine

## 2012-07-07 DIAGNOSIS — Z0000 Encounter for general adult medical examination without abnormal findings: Secondary | ICD-10-CM

## 2012-07-07 NOTE — Telephone Encounter (Signed)
Message copied by Judy Pimple on Fri Jul 07, 2012  9:08 AM ------      Message from: Alvina Chou      Created: Fri Jun 23, 2012 11:27 AM      Regarding: lab orders for Tuesday, 5.27.14       Patient is scheduled for CPX labs, please order future labs, Thanks , Terri       ------

## 2012-07-11 ENCOUNTER — Other Ambulatory Visit (INDEPENDENT_AMBULATORY_CARE_PROVIDER_SITE_OTHER): Payer: Private Health Insurance - Indemnity

## 2012-07-11 DIAGNOSIS — Z0000 Encounter for general adult medical examination without abnormal findings: Secondary | ICD-10-CM

## 2012-07-11 LAB — LIPID PANEL
Cholesterol: 171 mg/dL (ref 0–200)
HDL: 52.7 mg/dL (ref >39.00)
LDL Cholesterol: 108 mg/dL — ABNORMAL HIGH (ref 0–99)
VLDL: 10.8 mg/dL (ref 0.0–40.0)

## 2012-07-11 LAB — CBC WITH DIFFERENTIAL/PLATELET
Basophils Absolute: 0 10*3/uL (ref 0.0–0.1)
Eosinophils Absolute: 0.2 10*3/uL (ref 0.0–0.7)
HCT: 41.8 % (ref 36.0–46.0)
Hemoglobin: 14.2 g/dL (ref 12.0–15.0)
Lymphs Abs: 2.4 10*3/uL (ref 0.7–4.0)
MCHC: 34 g/dL (ref 30.0–36.0)
MCV: 88.2 fl (ref 78.0–100.0)
Monocytes Absolute: 0.4 10*3/uL (ref 0.1–1.0)
Monocytes Relative: 6.1 % (ref 3.0–12.0)
Neutro Abs: 4 10*3/uL (ref 1.4–7.7)
Platelets: 229 10*3/uL (ref 150.0–400.0)
RDW: 13.8 % (ref 11.5–14.6)

## 2012-07-11 LAB — COMPREHENSIVE METABOLIC PANEL
ALT: 18 U/L (ref 0–35)
Alkaline Phosphatase: 55 U/L (ref 39–117)
CO2: 28 mEq/L (ref 19–32)
Creatinine, Ser: 0.9 mg/dL (ref 0.4–1.2)
GFR: 65.49 mL/min (ref >60.00)
Sodium: 139 mEq/L (ref 135–145)
Total Bilirubin: 0.6 mg/dL (ref 0.3–1.2)
Total Protein: 7 g/dL (ref 6.0–8.3)

## 2012-07-11 LAB — TSH: TSH: 2.08 u[IU]/mL (ref 0.35–5.50)

## 2012-07-14 ENCOUNTER — Encounter: Payer: Private Health Insurance - Indemnity | Admitting: Family Medicine

## 2012-07-17 ENCOUNTER — Encounter: Payer: Self-pay | Admitting: Family Medicine

## 2012-07-17 ENCOUNTER — Ambulatory Visit (INDEPENDENT_AMBULATORY_CARE_PROVIDER_SITE_OTHER): Payer: Private Health Insurance - Indemnity | Admitting: Family Medicine

## 2012-07-17 VITALS — BP 126/74 | HR 61 | Temp 98.7°F | Ht 62.25 in | Wt 180.5 lb

## 2012-07-17 DIAGNOSIS — Z0000 Encounter for general adult medical examination without abnormal findings: Secondary | ICD-10-CM

## 2012-07-17 DIAGNOSIS — I10 Essential (primary) hypertension: Secondary | ICD-10-CM

## 2012-07-17 NOTE — Patient Instructions (Addendum)
Take care of yourself  Work on healthy diet and exercise  Schedule follow up for mole removal (15 minute appt is ok )
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
## 2012-07-19 ENCOUNTER — Ambulatory Visit: Payer: Private Health Insurance - Indemnity | Admitting: Family Medicine

## 2012-07-19 NOTE — Assessment & Plan Note (Signed)
bp in fair control at this time  No changes needed  Disc lifstyle change with low sodium diet and exercise  Labs reviewed  

## 2012-07-19 NOTE — Assessment & Plan Note (Signed)
Reviewed health habits including diet and exercise and skin cancer prevention Also reviewed health mt list, fam hx and immunizations  Rev wellness labs in detail 

## 2012-08-02 ENCOUNTER — Other Ambulatory Visit: Payer: Self-pay | Admitting: Family Medicine

## 2012-08-14 ENCOUNTER — Ambulatory Visit: Payer: Private Health Insurance - Indemnity | Admitting: Family Medicine

## 2012-12-21 ENCOUNTER — Other Ambulatory Visit: Payer: Self-pay

## 2013-02-16 ENCOUNTER — Other Ambulatory Visit: Payer: Private Health Insurance - Indemnity

## 2013-02-21 ENCOUNTER — Encounter: Payer: Private Health Insurance - Indemnity | Admitting: Family Medicine

## 2013-08-15 ENCOUNTER — Other Ambulatory Visit: Payer: Self-pay

## 2013-08-15 DIAGNOSIS — Z1231 Encounter for screening mammogram for malignant neoplasm of breast: Secondary | ICD-10-CM

## 2013-08-16 ENCOUNTER — Encounter: Payer: Self-pay | Admitting: Family Medicine

## 2013-08-20 ENCOUNTER — Telehealth (INDEPENDENT_AMBULATORY_CARE_PROVIDER_SITE_OTHER): Payer: 59 | Admitting: Family Medicine

## 2013-08-20 DIAGNOSIS — Z0000 Encounter for general adult medical examination without abnormal findings: Secondary | ICD-10-CM

## 2013-08-20 DIAGNOSIS — I10 Essential (primary) hypertension: Secondary | ICD-10-CM

## 2013-08-20 NOTE — Telephone Encounter (Signed)
Message copied by Abner Greenspan on Mon Aug 20, 2013  9:16 PM ------      Message from: Ellamae Sia      Created: Tue Aug 14, 2013 10:17 AM      Regarding: Lab orders for Tuesday, 7.7.15       Patient is scheduled for CPX labs, please order future labs, Thanks , Terri       ------

## 2013-08-21 ENCOUNTER — Other Ambulatory Visit (INDEPENDENT_AMBULATORY_CARE_PROVIDER_SITE_OTHER): Payer: 59

## 2013-08-21 DIAGNOSIS — Z0000 Encounter for general adult medical examination without abnormal findings: Secondary | ICD-10-CM

## 2013-08-21 LAB — LIPID PANEL
Cholesterol: 170 mg/dL (ref 0–200)
HDL: 43.8 mg/dL (ref >39.00)
LDL CALC: 109 mg/dL — AB (ref 0–99)
NonHDL: 126.2
TRIGLYCERIDES: 88 mg/dL (ref 0.0–149.0)
Total CHOL/HDL Ratio: 4
VLDL: 17.6 mg/dL (ref 0.0–40.0)

## 2013-08-21 LAB — TSH: TSH: 1.06 u[IU]/mL (ref 0.35–4.50)

## 2013-08-21 LAB — CBC WITH DIFFERENTIAL/PLATELET
BASOS PCT: 0.5 % (ref 0.0–3.0)
Basophils Absolute: 0 10*3/uL (ref 0.0–0.1)
EOS ABS: 0.2 10*3/uL (ref 0.0–0.7)
EOS PCT: 2.4 % (ref 0.0–5.0)
HCT: 44.6 % (ref 36.0–46.0)
HEMOGLOBIN: 14.8 g/dL (ref 12.0–15.0)
LYMPHS PCT: 26.6 % (ref 12.0–46.0)
Lymphs Abs: 2.6 10*3/uL (ref 0.7–4.0)
MCHC: 33.1 g/dL (ref 30.0–36.0)
MCV: 88.8 fl (ref 78.0–100.0)
MONOS PCT: 4.8 % (ref 3.0–12.0)
Monocytes Absolute: 0.5 10*3/uL (ref 0.1–1.0)
NEUTROS ABS: 6.5 10*3/uL (ref 1.4–7.7)
Neutrophils Relative %: 65.7 % (ref 43.0–77.0)
Platelets: 285 10*3/uL (ref 150.0–400.0)
RBC: 5.02 Mil/uL (ref 3.87–5.11)
RDW: 13.9 % (ref 11.5–15.5)
WBC: 9.8 10*3/uL (ref 4.0–10.5)

## 2013-08-21 LAB — COMPREHENSIVE METABOLIC PANEL
ALK PHOS: 73 U/L (ref 39–117)
ALT: 14 U/L (ref 0–35)
AST: 20 U/L (ref 0–37)
Albumin: 4 g/dL (ref 3.5–5.2)
BUN: 16 mg/dL (ref 6–23)
CO2: 28 mEq/L (ref 19–32)
CREATININE: 0.8 mg/dL (ref 0.4–1.2)
Calcium: 9.8 mg/dL (ref 8.4–10.5)
Chloride: 107 mEq/L (ref 96–112)
GFR: 81.13 mL/min (ref >60.00)
GLUCOSE: 99 mg/dL (ref 70–99)
Potassium: 4.1 mEq/L (ref 3.5–5.1)
Sodium: 141 mEq/L (ref 135–145)
Total Bilirubin: 0.6 mg/dL (ref 0.2–1.2)
Total Protein: 7.6 g/dL (ref 6.0–8.3)

## 2013-08-23 ENCOUNTER — Ambulatory Visit
Admission: RE | Admit: 2013-08-23 | Discharge: 2013-08-23 | Disposition: A | Discharge status: Home / Self Care | Payer: 59 | Source: Ambulatory Visit

## 2013-08-23 DIAGNOSIS — Z1231 Encounter for screening mammogram for malignant neoplasm of breast: Secondary | ICD-10-CM

## 2013-08-29 ENCOUNTER — Telehealth: Payer: Self-pay | Admitting: Family Medicine

## 2013-08-29 ENCOUNTER — Ambulatory Visit (INDEPENDENT_AMBULATORY_CARE_PROVIDER_SITE_OTHER): Payer: 59 | Admitting: Family Medicine

## 2013-08-29 ENCOUNTER — Encounter: Payer: Self-pay | Admitting: Family Medicine

## 2013-08-29 VITALS — BP 124/76 | HR 50 | Temp 98.5°F | Ht 62.25 in | Wt 172.8 lb

## 2013-08-29 DIAGNOSIS — R0989 Other specified symptoms and signs involving the circulatory and respiratory systems: Secondary | ICD-10-CM

## 2013-08-29 DIAGNOSIS — IMO0002 Reserved for concepts with insufficient information to code with codable children: Secondary | ICD-10-CM | POA: Insufficient documentation

## 2013-08-29 DIAGNOSIS — Z0000 Encounter for general adult medical examination without abnormal findings: Secondary | ICD-10-CM

## 2013-08-29 DIAGNOSIS — R0683 Snoring: Secondary | ICD-10-CM | POA: Insufficient documentation

## 2013-08-29 DIAGNOSIS — G4730 Sleep apnea, unspecified: Secondary | ICD-10-CM

## 2013-08-29 DIAGNOSIS — I10 Essential (primary) hypertension: Secondary | ICD-10-CM

## 2013-08-29 DIAGNOSIS — E669 Obesity, unspecified: Secondary | ICD-10-CM

## 2013-08-29 DIAGNOSIS — G4733 Obstructive sleep apnea (adult) (pediatric): Secondary | ICD-10-CM | POA: Insufficient documentation

## 2013-08-29 DIAGNOSIS — R0609 Other forms of dyspnea: Secondary | ICD-10-CM

## 2013-08-29 MED ORDER — ATENOLOL 25 MG PO TABS: ORAL_TABLET | ORAL | Status: DC

## 2013-08-29 NOTE — Assessment & Plan Note (Signed)
Great job so far with 20 lb wt loss  Commended

## 2013-08-29 NOTE — Progress Notes (Signed)
Pre visit review using our clinic review tool, if applicable. No additional management support is needed unless otherwise documented below in the visit note. 

## 2013-08-29 NOTE — Assessment & Plan Note (Signed)
Reviewed health habits including diet and exercise and skin cancer prevention Reviewed appropriate screening tests for age  Also reviewed health mt list, fam hx and immunization status , as well as social and family history   See HPI Lab rev  

## 2013-08-29 NOTE — Patient Instructions (Signed)
Stop up front for referrals  If you are interested in a shingles/zoster vaccine - call your insurance to check on coverage,( you should not get it within 1 month of other vaccines) , then call us for a prescription  for it to take to a pharmacy that gives the shot , or make a nurse visit to get it here depending on your coverage Keep working on weight loss and healthy habits

## 2013-08-29 NOTE — Assessment & Plan Note (Signed)
bp in fair control at this time  BP Readings from Last 1 Encounters:  08/29/13 124/76   No changes needed Disc lifstyle change with low sodium diet and exercise  Refilled atenolol Rev lab

## 2013-08-29 NOTE — Assessment & Plan Note (Signed)
With witnessed apnea and day time sedation Ref to pulm for eval and sleep study

## 2013-08-29 NOTE — Telephone Encounter (Signed)
Relevant patient education assigned to patient using Emmi. ° °

## 2013-08-29 NOTE — Progress Notes (Signed)
Subjective:    Patient ID: Jasmine Oliver, female    DOB: January 02, 1951, 63 y.o.   MRN: [id number]  HPI Here for health maintenance exam and to review chronic medical problems    Doing well and having a busy summer  Took a trip to [place] -it was great   She has had 2 exp to strep throat for kids  [facility] and [facility] tx for strep with ST -no test   Thinks she has sleep apnea  Snores loudly Witnessed apnea from husband  Also tired during the day    Wt is down 8 lb with bmi of 31 She has been working on it  By her scale - has lost 20 lb since Jan -doing great ! Is eating better and exercising - walking  Also plays golf   Zoster status - has not had vaccine/ did have the dz as a teen , may be interested in imm if ins covers it    Flu shot 1/14- did not get this season - will get it in the fall   Pap -pt has declined - no new partners , no hx of abn paps , no gyn symptoms  Also total hysterectomy  She notes some pain with intercourse due to small vaginal opening and wonders if something can be done about this  No vaginal dryness   Mammogram 7/15  Self exam-no lumps / does not check regularly   colonosc 4/09 Hx of polyps- but after record review - told her recall was 10 years   Td 1/14   bp is stable today  No cp or palpitations or headaches or edema  No side effects to medicines  BP Readings from Last 3 Encounters:  08/29/13 124/76  07/17/12 126/74  03/10/12 114/64      Results for orders placed in visit on 08/20/13  CBC WITH DIFFERENTIAL      Result Value Ref Range   WBC 9.8  4.0 - 10.5 K/uL   RBC 5.02  3.87 - 5.11 Mil/uL   Hemoglobin 14.8  12.0 - 15.0 g/dL   HCT 44.6  36.0 - 46.0 %   MCV 88.8  78.0 - 100.0 fl   MCHC 33.1  30.0 - 36.0 g/dL   RDW 13.9  11.5 - 15.5 %   Platelets 285.0  150.0 - 400.0 K/uL   Neutrophils Relative % 65.7  43.0 - 77.0 %   Lymphocytes Relative 26.6  12.0 - 46.0 %   Monocytes Relative 4.8  3.0 - 12.0 %   Eosinophils  Relative 2.4  0.0 - 5.0 %   Basophils Relative 0.5  0.0 - 3.0 %   Neutro Abs 6.5  1.4 - 7.7 K/uL   Lymphs Abs 2.6  0.7 - 4.0 K/uL   Monocytes Absolute 0.5  0.1 - 1.0 K/uL   Eosinophils Absolute 0.2  0.0 - 0.7 K/uL   Basophils Absolute 0.0  0.0 - 0.1 K/uL  COMPREHENSIVE METABOLIC PANEL      Result Value Ref Range   Sodium 141  135 - 145 mEq/L   Potassium 4.1  3.5 - 5.1 mEq/L   Chloride 107  96 - 112 mEq/L   CO2 28  19 - 32 mEq/L   Glucose, Bld 99  70 - 99 mg/dL   BUN 16  6 - 23 mg/dL   Creatinine, Ser 0.8  0.4 - 1.2 mg/dL   Total Bilirubin 0.6  0.2 - 1.2 mg/dL   Alkaline Phosphatase  73  39 - 117 U/L   AST 20  0 - 37 U/L   ALT 14  0 - 35 U/L   Total Protein 7.6  6.0 - 8.3 g/dL   Albumin 4.0  3.5 - 5.2 g/dL   Calcium 9.8  8.4 - 10.5 mg/dL   GFR 81.13  >60.00 mL/min  TSH      Result Value Ref Range   TSH 1.06  0.35 - 4.50 uIU/mL  LIPID PANEL      Result Value Ref Range   Cholesterol 170  0 - 200 mg/dL   Triglycerides 88.0  0.0 - 149.0 mg/dL   HDL 43.80  >39.00 mg/dL   VLDL 17.6  0.0 - 40.0 mg/dL   LDL Cholesterol 109 (*) 0 - 99 mg/dL   Total CHOL/HDL Ratio 4     NonHDL 126.20      Not a bad cholesterol profile  Ratio went up - needs to work on HDL   Patient Active Problem List   Diagnosis Date Noted  . Obesity 04/13/2011  . History of colon polyps 04/13/2011  . Routine general medical examination at a health care facility 12/17/2010  . HYPERTENSION, ESSENTIAL NOS 10/27/2009  . STRESS REACTION, ACUTE, WITH EMOTIONAL DISTURBANCE 06/06/2007   Past Medical History  Diagnosis Date  . Colon polyps   . Elevated BP     without hypertension  . Nevus     on left retina-- is watched by opthy   No past surgical history on file. History  Substance Use Topics  . Smoking status: Never Smoker   . Smokeless tobacco: Not on file  . Alcohol Use: Yes     Comment: occ- wine   Family History  Problem Relation Age of Onset  . Coronary artery disease Mother   . Heart failure  Mother   . Hypertension Father   . Coronary artery disease Father   . Cancer Father     smoker- lung cancer   . Hypertension Sister   . ADD / ADHD Daughter   . Alcohol abuse Son     in rehabilitation  . ADD / ADHD Son    No Known Allergies Current Outpatient Prescriptions on File Prior to Visit  Medication Sig Dispense Refill  . atenolol (TENORMIN) 25 MG tablet TAKE 1 TABLET BY MOUTH DAILY  90 tablet  3  . Multiple Vitamins-Calcium (VIACTIV MULTI-VITAMIN PO) Take 2 tablets by mouth daily.        . NON FORMULARY zzz-quil taking as directed       No current facility-administered medications on file prior to visit.    Review of Systems Review of Systems  Constitutional: Negative for fever, appetite change,  and unexpected weight change.  Eyes: Negative for pain and visual disturbance.  Respiratory: Negative for cough and shortness of breath.   Cardiovascular: Negative for cp or palpitations    Gastrointestinal: Negative for nausea, diarrhea and constipation.  Genitourinary: Negative for urgency and frequency. pos for painful intercourse  Skin: Negative for pallor or rash   Neurological: Negative for weakness, light-headedness, numbness and headaches. pos for loud snoring and somnolence Hematological: Negative for adenopathy. Does not bruise/bleed easily.  Psychiatric/Behavioral: Negative for dysphoric mood. The patient is not nervous/anxious.         Objective:   Physical Exam  Constitutional: She appears well-developed and well-nourished. No distress.  obese and well appearing   HENT:  Head: Normocephalic and atraumatic.  Right Ear: External ear normal.  Left  Ear: External ear normal.  Mouth/Throat: Oropharynx is clear and moist.  Eyes: Conjunctivae and EOM are normal. Pupils are equal, round, and reactive to light. No scleral icterus.  Neck: Normal range of motion. Neck supple. No JVD present. Carotid bruit is not present. No thyromegaly present.  Cardiovascular: Normal  rate, regular rhythm, normal heart sounds and intact distal pulses.  Exam reveals no gallop.   Pulmonary/Chest: Effort normal and breath sounds normal. No respiratory distress. She has no wheezes. She exhibits no tenderness.  Abdominal: Soft. Bowel sounds are normal. She exhibits no distension, no abdominal bruit and no mass. There is no tenderness.  Genitourinary: No breast swelling, tenderness, discharge or bleeding.  Breast exam: No mass, nodules, thickening, tenderness, bulging, retraction, inflamation, nipple discharge or skin changes noted.  No axillary or clavicular LA.      Musculoskeletal: Normal range of motion. She exhibits no edema and no tenderness.  Lymphadenopathy:    She has no cervical adenopathy.  Neurological: She is alert. She has normal reflexes. No cranial nerve deficit. She exhibits normal muscle tone. Coordination normal.  Skin: Skin is warm and dry. No rash noted. No erythema. No pallor.  Psychiatric: She has a normal mood and affect.          Assessment & Plan:   Problem List Items Addressed This Visit     Cardiovascular and Mediastinum   HYPERTENSION, ESSENTIAL NOS - Primary      bp in fair control at this time  BP Readings from Last 1 Encounters:  08/29/13 124/76   No changes needed Disc lifstyle change with low sodium diet and exercise  Refilled atenolol Rev lab    Relevant Medications      atenolol (TENORMIN) tablet     Other   Routine general medical examination at a health care facility     Reviewed health habits including diet and exercise and skin cancer prevention Reviewed appropriate screening tests for age  Also reviewed health mt list, fam hx and immunization status , as well as social and family history   See HPI Lab rev    Obesity     Great job so far with 20 lb wt loss  Commended     Snoring     With witnessed apnea and day time sedation Ref to pulm for eval and sleep study    Relevant Orders      Ambulatory referral to  Pulmonology   Unspecified sleep apnea   Relevant Orders      Ambulatory referral to Pulmonology   Dyspareunia     Per pt -small introitus and problems with that Will ref to gyn  Hx of total hyst in past    Relevant Orders      Ambulatory referral to Gynecology

## 2013-08-29 NOTE — Assessment & Plan Note (Signed)
Per pt -small introitus and problems with that Will ref to gyn  Hx of total hyst in past

## 2013-09-18 ENCOUNTER — Other Ambulatory Visit: Payer: Self-pay | Admitting: Family Medicine

## 2013-10-11 ENCOUNTER — Ambulatory Visit (INDEPENDENT_AMBULATORY_CARE_PROVIDER_SITE_OTHER): Payer: 59 | Admitting: Internal Medicine

## 2013-10-11 ENCOUNTER — Encounter: Payer: Self-pay | Admitting: Internal Medicine

## 2013-10-11 VITALS — BP 130/72 | HR 55 | Ht 62.0 in | Wt 174.8 lb

## 2013-10-11 DIAGNOSIS — G478 Other sleep disorders: Secondary | ICD-10-CM

## 2013-10-11 DIAGNOSIS — G4730 Sleep apnea, unspecified: Secondary | ICD-10-CM

## 2013-10-11 DIAGNOSIS — G4700 Insomnia, unspecified: Secondary | ICD-10-CM

## 2013-10-11 DIAGNOSIS — K219 Gastro-esophageal reflux disease without esophagitis: Secondary | ICD-10-CM

## 2013-10-11 NOTE — Patient Instructions (Signed)
Order- split protocol NPSG   Dx sleep disordered breathing

## 2013-10-11 NOTE — Progress Notes (Signed)
10/11/13- 64 yoF never smoker referred courtesy of Dr Glori Bickers for sleep medicine evaluation. Husband here She describes difficulty quieting her "busy brain" to initiate sleep. Benadryl causes too prolonged drowsiness. Husband says she stops breathing, snores loudly, worse on back. Occasional naps. Sometimes wakes gasping or choking. No ENT surgery. Treated for HBP. Weight has varied almost 100 lbs. Denies hx of GERD, asthma or nasal congestion.  Sister snores.  She is retired Probation officer  Prior to Admission medications   Medication Sig Start Date End Date Taking? Authorizing Provider  atenolol (TENORMIN) 25 MG tablet TAKE 1 TABLET BY MOUTH DAILY 08/29/13  Yes Abner Greenspan, MD  Multiple Vitamins-Calcium (VIACTIV MULTI-VITAMIN PO) Take 2 tablets by mouth daily.     Yes Historical Provider, MD  NON FORMULARY zzz-quil taking as directed   Yes Historical Provider, MD   Past Medical History  Diagnosis Date  . Colon polyps   . Elevated BP     without hypertension  . Nevus     on left retina-- is watched by opthy   Past Surgical History  Procedure Laterality Date  . Total abdominal hysterectomy     Family History  Problem Relation Age of Onset  . Coronary artery disease Mother   . Heart failure Mother   . Hypertension Father   . Coronary artery disease Father   . Cancer Father     smoker- lung cancer   . Hypertension Sister   . ADD / ADHD Daughter   . Alcohol abuse Son     in rehabilitation  . ADD / ADHD Son    History   Social History  . Marital Status: Married    Spouse Name: N/A    Number of Children: 4  . Years of Education: N/A   Occupational History  . Operations manager    Social History Main Topics  . Smoking status: Never Smoker   . Smokeless tobacco: Not on file  . Alcohol Use: Yes     Comment: occ- wine  . Drug Use: No  . Sexual Activity: Not on file   Other Topics Concern  . Not on file   Social History Narrative   Plays Golf and goes to  the gym   ROS-see HPI Constitutional:   No-   weight loss, night sweats, fevers, chills, +fatigue, lassitude. HEENT:   No-  headaches, difficulty swallowing, tooth/dental problems, sore throat,       No-  sneezing, itching, ear ache, nasal congestion, post nasal drip,  CV:  No-   chest pain, orthopnea, PND, swelling in lower extremities, anasarca,                                  dizziness, palpitations Resp: No-   shortness of breath with exertion or at rest.              No-   productive cough,  No non-productive cough,  No- coughing up of blood.              No-   change in color of mucus.  No- wheezing.   Skin: No-   rash or lesions. GI:  No-   heartburn, indigestion, abdominal pain, nausea, vomiting, diarrhea,                 change in bowel habits, loss of appetite GU: No-   dysuria, change in color of urine,  no urgency or frequency.  No- flank pain. MS:  No-   joint pain or swelling.  No- decreased range of motion.  No- back pain. Neuro-     nothing unusual Psych:  No- change in mood or affect. No depression or anxiety.  No memory loss.  OBJ- Physical Exam General- Alert, Oriented, Affect-appropriate, Distress- none acute, overweight Skin- rash-none, lesions- none, excoriation- none Lymphadenopathy- none Head- atraumatic            Eyes- Gross vision intact, PERRLA, conjunctivae and secretions clear            Ears- Hearing, canals-normal            Nose- Clear, no-Septal dev, mucus, polyps, erosion, perforation             Throat- Mallampati IV , mucosa clear , drainage- none, tonsils- atrophic Neck- flexible , trachea midline, no stridor , thyroid nl, carotid no bruit Chest - symmetrical excursion , unlabored           Heart/CV- RRR , no murmur , no gallop  , no rub, nl s1 s2                           - JVD- none , edema- none, stasis changes- none, varices- none           Lung- clear to P&A, wheeze- none, cough- none , dullness-none, rub- none           Chest wall-  Abd-  tender-no, distended-no, bowel sounds-present, HSM- no Br/ Gen/ Rectal- Not done, not indicated Extrem- cyanosis- none, clubbing, none, atrophy- none, strength- nl Neuro- grossly intact to observation

## 2013-10-12 DIAGNOSIS — G4700 Insomnia, unspecified: Secondary | ICD-10-CM | POA: Insufficient documentation

## 2013-10-12 DIAGNOSIS — K219 Gastro-esophageal reflux disease without esophagitis: Secondary | ICD-10-CM | POA: Insufficient documentation

## 2013-10-12 NOTE — Assessment & Plan Note (Signed)
History and exam are consistent with OSA., probably complicated by episodes of insomnia and occasional GERD. Plan- Schedule sleep study

## 2013-10-12 NOTE — Assessment & Plan Note (Signed)
Episodes of waking suddenly with choking cough sound like reflux, even though sne doesn't notice while awake. Plan- Try elevating HPB on brick and pay attention to reflux symptoms.

## 2013-10-14 ENCOUNTER — Encounter (HOSPITAL_BASED_OUTPATIENT_CLINIC_OR_DEPARTMENT_OTHER): Payer: 59

## 2013-11-29 ENCOUNTER — Ambulatory Visit: Payer: 59 | Admitting: Internal Medicine

## 2013-12-06 ENCOUNTER — Encounter (HOSPITAL_BASED_OUTPATIENT_CLINIC_OR_DEPARTMENT_OTHER): Payer: 59

## 2014-01-17 ENCOUNTER — Ambulatory Visit: Payer: 59 | Admitting: Internal Medicine

## 2014-02-18 ENCOUNTER — Ambulatory Visit (HOSPITAL_BASED_OUTPATIENT_CLINIC_OR_DEPARTMENT_OTHER): Payer: 59 | Attending: Internal Medicine | Admitting: Radiology

## 2014-02-18 VITALS — Ht 62.0 in | Wt 180.0 lb

## 2014-02-18 DIAGNOSIS — R0683 Snoring: Secondary | ICD-10-CM | POA: Diagnosis not present

## 2014-02-18 DIAGNOSIS — G4710 Hypersomnia, unspecified: Secondary | ICD-10-CM | POA: Diagnosis present

## 2014-02-18 DIAGNOSIS — G4733 Obstructive sleep apnea (adult) (pediatric): Secondary | ICD-10-CM | POA: Diagnosis not present

## 2014-02-18 DIAGNOSIS — G4730 Sleep apnea, unspecified: Secondary | ICD-10-CM

## 2014-02-23 DIAGNOSIS — G478 Other sleep disorders: Secondary | ICD-10-CM

## 2014-02-23 DIAGNOSIS — G4733 Obstructive sleep apnea (adult) (pediatric): Secondary | ICD-10-CM

## 2014-02-23 NOTE — Sleep Study (Signed)
   NAME: Jasmine Oliver DATE OF BIRTH:  [birth year] MEDICAL RECORD NUMBER [id number]  LOCATION: [facility]  PHYSICIAN: Aften Lipsey D  DATE OF STUDY: 02/18/2014  SLEEP STUDY TYPE: Nocturnal Polysomnogram               REFERRING PHYSICIAN: Baird Lyons D, MD  INDICATION FOR STUDY: Hypersomnia with sleep apnea  EPWORTH SLEEPINESS SCORE:   16/24 HEIGHT: 5\' 2"  (157.5 cm)  WEIGHT: 81.647 kg (180 lb)    Body mass index is 32.91 kg/(m^2).  NECK SIZE: 14 in.  MEDICATIONS: Charted for review  SLEEP ARCHITECTURE: Total sleep time 345 minutes with sleep efficiency 96.5%. Stage I was 2.3%, stage II 87.8%, stage III 0.7%, REM 9.1% of total sleep time. Sleep latency 6 minutes, REM latency 182 minutes, awake after sleep onset 6.5 minutes, arousal index 26.1, bedtime medication: Over-the-counter sleep aid  RESPIRATORY DATA: Apnea hypopnea index (AHI) 12.2 per hour. 70 total events scored including one obstructive apnea, 2 central apneas, 1 mixed apnea, 66 hypopneas. Events were more common while supine. REM AHI 28.6 per hour. Were not enough early events to permit application of split protocol CPAP titration  OXYGEN DATA: Moderately loud snoring with oxygen desaturation to a nadir of 85% and mean saturation 94.2% on room air  CARDIAC DATA: Sinus rhythm with PACs  MOVEMENT/PARASOMNIA: 20 total limb jerks counted of which 6 were associated with arousal or awakening for a periodic limb movement with arousal index of 1 per hour. No bathroom trips  IMPRESSION/ RECOMMENDATION:   1) Mild obstructive sleep apnea/hypopnea syndrome, AHI 12.2 per hour. Events in all positions, especially supine. REM AHI 28.6 per hour. Moderately loud snoring with oxygen desaturation to a nadir of 85% and mean saturation 94.2% on room air. 2) There were not enough events to permit application of split protocol CPAP titration.   Deneise Lever Diplomate, American Board of Sleep Medicine  ELECTRONICALLY  SIGNED ON:  02/23/2014, 1:54 PM [place] PH: [phone]   FX: [phone] [place]

## 2014-03-25 ENCOUNTER — Ambulatory Visit (INDEPENDENT_AMBULATORY_CARE_PROVIDER_SITE_OTHER): Payer: 59 | Admitting: Internal Medicine

## 2014-03-25 ENCOUNTER — Encounter: Payer: Self-pay | Admitting: Internal Medicine

## 2014-03-25 VITALS — BP 118/72 | HR 51 | Ht 62.0 in | Wt 188.8 lb

## 2014-03-25 DIAGNOSIS — G4733 Obstructive sleep apnea (adult) (pediatric): Secondary | ICD-10-CM

## 2014-03-25 DIAGNOSIS — G4700 Insomnia, unspecified: Secondary | ICD-10-CM

## 2014-03-25 NOTE — Patient Instructions (Addendum)
Flu vax  Order- referral to Orthodontist Dr Oneal Grout   Consider oral appliance for OSA  If needed, we can always talk again about CPAP

## 2014-03-25 NOTE — Assessment & Plan Note (Signed)
Again reviewed basic sleep hygiene including getting up to read in the middle of the night is not sleepy. Discussed diphenhydramine.

## 2014-03-25 NOTE — Progress Notes (Signed)
10/11/13- 62 yoF never smoker referred courtesy of Dr Glori Bickers for sleep medicine evaluation. Husband here She describes difficulty quieting her "busy brain" to initiate sleep. Benadryl causes too prolonged drowsiness. Husband says she stops breathing, snores loudly, worse on back. Occasional naps. Sometimes wakes gasping or choking. No ENT surgery. Treated for HBP. Weight has varied almost 100 lbs. Denies hx of GERD, asthma or nasal congestion.  Sister snores.  She is retired Probation officer  03/25/14- 73 yoF never smoker referred courtesy of Dr Glori Bickers for sleep medicine evaluation. Husband here FOLLOWS FOR: review sleep study with patient. NPSG 02/18/14 AHI 12.2/ hr, mild OSA, weight 180 lbs Diphenhydramine for insomnia  ROS-see HPI Constitutional:   No-   weight loss, night sweats, fevers, chills, +fatigue, lassitude. HEENT:   No-  headaches, difficulty swallowing, tooth/dental problems, sore throat,       No-  sneezing, itching, ear ache, nasal congestion, post nasal drip,  CV:  No-   chest pain, orthopnea, PND, swelling in lower extremities, anasarca,                                  dizziness, palpitations Resp: No-   shortness of breath with exertion or at rest.              No-   productive cough,  No non-productive cough,  No- coughing up of blood.              No-   change in color of mucus.  No- wheezing.   Skin: No-   rash or lesions. GI:  No-   heartburn, indigestion, abdominal pain, nausea, vomiting, GU: . MS:  No-   joint pain or swelling.   Neuro-     nothing unusual Psych:  No- change in mood or affect. No depression or anxiety.  No memory loss.  OBJ- Physical Exam General- Alert, Oriented, Affect-appropriate, Distress- none acute, overweight Skin- rash-none, lesions- none, excoriation- none Lymphadenopathy- none Head- atraumatic            Eyes- Gross vision intact, PERRLA, conjunctivae and secretions clear            Ears- Hearing, canals-normal  Nose- Clear, no-Septal dev, mucus, polyps, erosion, perforation             Throat- Mallampati IV , mucosa clear , drainage- none, tonsils- atrophic Neck- flexible , trachea midline, no stridor , thyroid nl, carotid no bruit Chest - symmetrical excursion , unlabored           Heart/CV- RRR , no murmur , no gallop  , no rub, nl s1 s2                           - JVD- none , edema- none, stasis changes- none, varices- none           Lung- clear to P&A, wheeze- none, cough- none , dullness-none, rub- none           Chest wall-  Abd-  Br/ Gen/ Rectal- Not done, not indicated Extrem- cyanosis- none, clubbing, none, atrophy- none, strength- nl Neuro- grossly intact to observation

## 2014-03-25 NOTE — Assessment & Plan Note (Signed)
Mild obstructive sleep apnea. We discussed conservative measures including weight loss and we compared CPAP to oral appliances. We think she might be a good candidate for an oral appliance as initial therapy. Plan-referral for consideration of oral appliance to treat obstructive sleep apnea

## 2014-07-01 ENCOUNTER — Ambulatory Visit: Payer: 59 | Admitting: Internal Medicine

## 2014-09-25 ENCOUNTER — Ambulatory Visit: Payer: 59 | Admitting: Internal Medicine

## 2014-11-21 ENCOUNTER — Other Ambulatory Visit: Payer: Self-pay | Admitting: Family Medicine

## 2015-01-06 ENCOUNTER — Other Ambulatory Visit: Payer: Self-pay

## 2015-01-06 DIAGNOSIS — Z1231 Encounter for screening mammogram for malignant neoplasm of breast: Secondary | ICD-10-CM

## 2015-01-06 LAB — HM PAP SMEAR: HM Pap smear: NORMAL

## 2015-01-14 ENCOUNTER — Encounter: Payer: Self-pay | Admitting: Family Medicine

## 2015-01-20 ENCOUNTER — Ambulatory Visit: Payer: 59 | Admitting: Internal Medicine

## 2015-01-23 ENCOUNTER — Ambulatory Visit
Admission: RE | Admit: 2015-01-23 | Discharge: 2015-01-23 | Disposition: A | Discharge status: Home / Self Care | Payer: 59 | Source: Ambulatory Visit

## 2015-01-23 DIAGNOSIS — Z1231 Encounter for screening mammogram for malignant neoplasm of breast: Secondary | ICD-10-CM

## 2015-01-29 ENCOUNTER — Other Ambulatory Visit: Payer: Self-pay | Admitting: Obstetrics and Gynecology

## 2015-01-29 ENCOUNTER — Encounter: Payer: Self-pay | Admitting: Family Medicine

## 2015-01-29 DIAGNOSIS — R928 Other abnormal and inconclusive findings on diagnostic imaging of breast: Secondary | ICD-10-CM

## 2015-02-05 ENCOUNTER — Ambulatory Visit
Admission: RE | Admit: 2015-02-05 | Discharge: 2015-02-05 | Disposition: A | Discharge status: Home / Self Care | Payer: 59 | Source: Ambulatory Visit | Attending: Obstetrics and Gynecology | Admitting: Obstetrics and Gynecology

## 2015-02-05 ENCOUNTER — Encounter: Payer: Self-pay | Admitting: Family Medicine

## 2015-02-05 ENCOUNTER — Other Ambulatory Visit: Payer: Self-pay | Admitting: Obstetrics and Gynecology

## 2015-02-05 ENCOUNTER — Ambulatory Visit (INDEPENDENT_AMBULATORY_CARE_PROVIDER_SITE_OTHER): Payer: 59 | Admitting: Family Medicine

## 2015-02-05 VITALS — BP 132/86 | HR 52 | Temp 98.3°F | Ht 62.0 in | Wt 156.8 lb

## 2015-02-05 DIAGNOSIS — R3129 Other microscopic hematuria: Secondary | ICD-10-CM | POA: Diagnosis not present

## 2015-02-05 DIAGNOSIS — R319 Hematuria, unspecified: Secondary | ICD-10-CM | POA: Diagnosis not present

## 2015-02-05 DIAGNOSIS — R928 Other abnormal and inconclusive findings on diagnostic imaging of breast: Secondary | ICD-10-CM

## 2015-02-05 LAB — POCT URINALYSIS DIPSTICK
Glucose, UA: NEGATIVE
KETONES UA: NEGATIVE
LEUKOCYTES UA: NEGATIVE
NITRITE UA: NEGATIVE
PH UA: 6
Protein, UA: NEGATIVE
Spec Grav, UA: >=1.03
Urobilinogen, UA: 0.2

## 2015-02-05 NOTE — Patient Instructions (Addendum)
Stop at check out for referral to urology for microscopic hematuria Drink lots of water Take care of yourself

## 2015-02-05 NOTE — Progress Notes (Signed)
Subjective:    Patient ID: Jasmine Oliver, female    DOB: [birth year], 64 y.o.   MRN: [id number]  HPI Here with hematuria (microscopic)   Had pos ua for blood at gyn  Not infx/cx was negative   Pt states she had microscopic hematuria - big w/o ? Done  Was 35 years ago  No change in urination  No blood in urine that she can see No back or flank pain  Has had a kidney stone in the past - probably 2010 or 2011 - did see a urologist in [place]- ? Name    Here: Results for orders placed or performed in visit on 02/05/15  POCT urinalysis dipstick  Result Value Ref Range   Color, UA Yellow    Clarity, UA Cloudy    Glucose, UA Neg.    Bilirubin, UA Trace    Ketones, UA Neg.    Spec Grav, UA >=1.030    Blood, UA Large    pH, UA 6.0    Protein, UA Neg.    Urobilinogen, UA 0.2    Nitrite, UA Neg.    Leukocytes, UA Negative Negative       Wt is down 32 lb since last feb  Intentional  Gave up sugar - has worked hard (also not eating more)  Also seeing a nutritionist  She is aiming for a bmi of 60 - keeps working on it     Very busy- caregiver  Mother 73 MIL back sugery BIL - has stage 4 prostate cancer   Had mammogram- recall with cysts on ultrasound    Patient Active Problem List   Diagnosis Date Noted  . Microscopic hematuria 02/05/2015  . GERD (gastroesophageal reflux disease) 10/12/2013  . Insomnia 10/12/2013  . Snoring 08/29/2013  . Obstructive sleep apnea 08/29/2013  . Dyspareunia 08/29/2013  . Obesity 04/13/2011  . History of colon polyps 04/13/2011  . Routine general medical examination at a health care facility 12/17/2010  . HYPERTENSION, ESSENTIAL NOS 10/27/2009  . STRESS REACTION, ACUTE, WITH EMOTIONAL DISTURBANCE 06/06/2007   Past Medical History  Diagnosis Date  . Colon polyps   . Elevated BP     without hypertension  . Nevus     on left retina-- is watched by opthy   Past Surgical History  Procedure Laterality Date  . Total abdominal  hysterectomy     Social History  Substance Use Topics  . Smoking status: Never Smoker   . Smokeless tobacco: None  . Alcohol Use: 0.0 oz/week    0 Standard drinks or equivalent per week     Comment: occ- wine   Family History  Problem Relation Age of Onset  . Coronary artery disease Mother   . Heart failure Mother   . Hypertension Father   . Coronary artery disease Father   . Cancer Father     smoker- lung cancer   . Hypertension Sister   . ADD / ADHD Daughter   . Alcohol abuse Son     in rehabilitation  . ADD / ADHD Son    No Known Allergies Current Outpatient Prescriptions on File Prior to Visit  Medication Sig Dispense Refill  . atenolol (TENORMIN) 25 MG tablet TAKE 1 TABLET BY MOUTH DAILY 90 tablet 1  . Multiple Vitamins-Calcium (VIACTIV MULTI-VITAMIN PO) Take 2 tablets by mouth daily.      . NON FORMULARY zzz-quil taking as directed     No current facility-administered medications on file prior to visit.  Review of Systems Review of Systems  Constitutional: Negative for fever, appetite change, fatigue and unexpected weight change.  Eyes: Negative for pain and visual disturbance.  Respiratory: Negative for cough and shortness of breath.   Cardiovascular: Negative for cp or palpitations    Gastrointestinal: Negative for nausea, diarrhea and constipation.  Genitourinary: Negative for urgency and frequency. neg for visible hematuria neg for flank pain or dysuria  Skin: Negative for pallor or rash   Neurological: Negative for weakness, light-headedness, numbness and headaches.  Hematological: Negative for adenopathy. Does not bruise/bleed easily.  Psychiatric/Behavioral: Negative for dysphoric mood. The patient is not nervous/anxious.         Objective:   Physical Exam  Constitutional: She appears well-developed and well-nourished. No distress.  overwt and well app  HENT:  Head: Normocephalic and atraumatic.  Eyes: Conjunctivae and EOM are normal. Pupils are  equal, round, and reactive to light.  Neck: Normal range of motion. Neck supple.  Cardiovascular: Normal rate, regular rhythm and normal heart sounds.   Pulmonary/Chest: Effort normal and breath sounds normal.  Abdominal: Soft. Bowel sounds are normal. She exhibits no distension and no mass. There is no tenderness. There is no rebound and no guarding.  No cva tenderness  Mild suprapubic tenderness  Musculoskeletal: She exhibits no edema.  Lymphadenopathy:    She has no cervical adenopathy.  Neurological: She is alert.  Skin: No rash noted.  Psychiatric: She has a normal mood and affect.          Assessment & Plan:   Problem List Items Addressed This Visit      Genitourinary   Microscopic hematuria - Primary    Pt states she had this with w/u many years ago  Has micro hematuria again No symptoms  Per pt -neg cx at gyn  Ref to urol for eval  ? If hx of kidney stones-no pain or symptoms of passing however       Relevant Orders   Ambulatory referral to Urology    Other Visit Diagnoses    Hematuria        Relevant Orders    POCT urinalysis dipstick (Completed)

## 2015-02-05 NOTE — Progress Notes (Signed)
Pre visit review using our clinic review tool, if applicable. No additional management support is needed unless otherwise documented below in the visit note. 

## 2015-02-06 LAB — HM DEXA SCAN: HM DEXA SCAN: NORMAL

## 2015-02-10 NOTE — Assessment & Plan Note (Signed)
Pt states she had this with w/u many years ago  Has micro hematuria again No symptoms  Per pt -neg cx at gyn  Ref to urol for eval  ? If hx of kidney stones-no pain or symptoms of passing however

## 2015-05-11 ENCOUNTER — Telehealth: Payer: Self-pay | Admitting: Family Medicine

## 2015-05-11 DIAGNOSIS — I10 Essential (primary) hypertension: Secondary | ICD-10-CM

## 2015-05-11 NOTE — Telephone Encounter (Signed)
-----   Message from Marchia Bond sent at 05/02/2015  9:26 AM EDT ----- Regarding: Cpx labs Mon 3/27, need orders. Thanks! :-) Please order  future cpx labs for pt's upcoming lab appt. Thanks Aniceto Boss

## 2015-05-12 ENCOUNTER — Other Ambulatory Visit (INDEPENDENT_AMBULATORY_CARE_PROVIDER_SITE_OTHER): Payer: 59

## 2015-05-12 DIAGNOSIS — I10 Essential (primary) hypertension: Secondary | ICD-10-CM | POA: Diagnosis not present

## 2015-05-12 LAB — CBC WITH DIFFERENTIAL/PLATELET
Basophils Absolute: 0 10*3/uL (ref 0.0–0.1)
Basophils Relative: 0.5 % (ref 0.0–3.0)
EOS PCT: 3.2 % (ref 0.0–5.0)
Eosinophils Absolute: 0.2 10*3/uL (ref 0.0–0.7)
HEMATOCRIT: 41.5 % (ref 36.0–46.0)
HEMOGLOBIN: 14 g/dL (ref 12.0–15.0)
LYMPHS ABS: 1.8 10*3/uL (ref 0.7–4.0)
Lymphocytes Relative: 30.2 % (ref 12.0–46.0)
MCHC: 33.8 g/dL (ref 30.0–36.0)
MCV: 88.8 fl (ref 78.0–100.0)
MONO ABS: 0.4 10*3/uL (ref 0.1–1.0)
MONOS PCT: 6 % (ref 3.0–12.0)
Neutro Abs: 3.6 10*3/uL (ref 1.4–7.7)
Neutrophils Relative %: 60.1 % (ref 43.0–77.0)
Platelets: 228 10*3/uL (ref 150.0–400.0)
RBC: 4.68 Mil/uL (ref 3.87–5.11)
RDW: 13.8 % (ref 11.5–15.5)
WBC: 6 10*3/uL (ref 4.0–10.5)

## 2015-05-12 LAB — TSH: TSH: 0.79 u[IU]/mL (ref 0.35–4.50)

## 2015-05-12 LAB — COMPREHENSIVE METABOLIC PANEL
ALBUMIN: 3.8 g/dL (ref 3.5–5.2)
ALK PHOS: 65 U/L (ref 39–117)
ALT: 9 U/L (ref 0–35)
AST: 15 U/L (ref 0–37)
BUN: 12 mg/dL (ref 6–23)
CALCIUM: 9.6 mg/dL (ref 8.4–10.5)
CO2: 32 mEq/L (ref 19–32)
CREATININE: 0.74 mg/dL (ref 0.40–1.20)
Chloride: 106 mEq/L (ref 96–112)
GFR: 84.46 mL/min (ref >60.00)
Glucose, Bld: 87 mg/dL (ref 70–99)
POTASSIUM: 4.4 meq/L (ref 3.5–5.1)
SODIUM: 142 meq/L (ref 135–145)
TOTAL PROTEIN: 6.8 g/dL (ref 6.0–8.3)
Total Bilirubin: 0.6 mg/dL (ref 0.2–1.2)

## 2015-05-12 LAB — LIPID PANEL
CHOLESTEROL: 127 mg/dL (ref 0–200)
HDL: 40.8 mg/dL (ref >39.00)
LDL Cholesterol: 76 mg/dL (ref 0–99)
NONHDL: 86.66
TRIGLYCERIDES: 51 mg/dL (ref 0.0–149.0)
Total CHOL/HDL Ratio: 3
VLDL: 10.2 mg/dL (ref 0.0–40.0)

## 2015-05-16 ENCOUNTER — Encounter: Payer: 59 | Admitting: Family Medicine

## 2015-05-19 ENCOUNTER — Encounter: Payer: 59 | Admitting: Family Medicine

## 2015-05-20 ENCOUNTER — Encounter: Payer: Self-pay | Admitting: Family Medicine

## 2015-05-20 ENCOUNTER — Encounter: Payer: Self-pay | Admitting: Gastroenterology

## 2015-05-20 ENCOUNTER — Ambulatory Visit (INDEPENDENT_AMBULATORY_CARE_PROVIDER_SITE_OTHER): Payer: 59 | Admitting: Family Medicine

## 2015-05-20 VITALS — BP 126/82 | HR 77 | Temp 98.4°F | Ht 62.0 in | Wt 142.0 lb

## 2015-05-20 DIAGNOSIS — I10 Essential (primary) hypertension: Secondary | ICD-10-CM

## 2015-05-20 DIAGNOSIS — Z1159 Encounter for screening for other viral diseases: Secondary | ICD-10-CM | POA: Insufficient documentation

## 2015-05-20 DIAGNOSIS — Z0000 Encounter for general adult medical examination without abnormal findings: Secondary | ICD-10-CM | POA: Diagnosis not present

## 2015-05-20 DIAGNOSIS — Z1283 Encounter for screening for malignant neoplasm of skin: Secondary | ICD-10-CM

## 2015-05-20 DIAGNOSIS — Z1211 Encounter for screening for malignant neoplasm of colon: Secondary | ICD-10-CM

## 2015-05-20 DIAGNOSIS — Z114 Encounter for screening for human immunodeficiency virus [HIV]: Secondary | ICD-10-CM

## 2015-05-20 NOTE — Patient Instructions (Addendum)
Weight and blood pressure are excellent Keep up the good work Exercise and eating fish /or taking omega 3 supplement will help HDL cholesterol  Labs look good Lab for HIV and Hep C screen today  Check out up front for your colonoscopy referral and dermatology referral   Use sunscreen!

## 2015-05-20 NOTE — Progress Notes (Signed)
Subjective:    Patient ID: Jasmine Oliver, female    DOB: 01-17-1951, 65 y.o.   MRN: [id number]  HPI Here for health maintenance exam and to review chronic medical problems    Wt is down 14 more lb with bmi of 25 Has lost 46 lb since 2/16 Intentional - doing great --has been charting what she eats - gets over 25 g of fiber per day, 45 g of protein , avoids processed foods , organic fruit, veg  Has worked with a nutritionist  Would love to be 135 lb  Eating well  Goes to the gym for exercise 2 times per wk and walks the other days  She has done some yoga   Is taking vitamin D daily 500 iu daily - she can try to inc to 5000 iu   Has had a bone density test (physicains for women)- normal range bone density   Socializes a lot  Wants to keep her cognitive health good as well  Memory is not as good as it was in the past    Hep C/HIV screen- is interested - will do today  Flu shot 12/16  Mm 12/16- fibrocystic change L breast-re check 1 y  Pap nl 11/16 she had pap at gyn that was normal  Had total hysterectomy Uses testosterone cream per gyn - and has disc risk/benefit for that   Colonoscopy 4/05 (per pt) she is due   Td 1/14  Zoster vaccine 4/16  bp is stable today - she has not taken her bp med for over a month now - does not need it  No cp or palpitations or headaches or edema  No side effects to medicines  BP Readings from Last 3 Encounters:  05/20/15 126/82  02/05/15 132/86  03/25/14 118/72   her cuff at home is accurate     OSA-lost weight to help this  No longer has gasping with sleep Is interested in a mouth guard at night  First has braces (Invisaline) - then will look into that  Overall sleeping better and no longer snoring       Chemistry      Component Value Date/Time   NA 142 05/12/2015 0804   K 4.4 05/12/2015 0804   CL 106 05/12/2015 0804   CO2 32 05/12/2015 0804   BUN 12 05/12/2015 0804   CREATININE 0.74 05/12/2015 0804      Component Value  Date/Time   CALCIUM 9.6 05/12/2015 0804   ALKPHOS 65 05/12/2015 0804   AST 15 05/12/2015 0804   ALT 9 05/12/2015 0804   BILITOT 0.6 05/12/2015 0804      Lab Results  Component Value Date   WBC 6.0 05/12/2015   HGB 14.0 05/12/2015   HCT 41.5 05/12/2015   MCV 88.8 05/12/2015   PLT 228.0 05/12/2015   Lab Results  Component Value Date   TSH 0.79 05/12/2015    Cholesterol Lab Results  Component Value Date   CHOL 127 05/12/2015   CHOL 170 08/21/2013   CHOL 171 07/11/2012   Lab Results  Component Value Date   HDL 40.80 05/12/2015   HDL 43.80 08/21/2013   HDL 52.70 07/11/2012   Lab Results  Component Value Date   LDLCALC 76 05/12/2015   LDLCALC 109* 08/21/2013   LDLCALC 108* 07/11/2012   Lab Results  Component Value Date   TRIG 51.0 05/12/2015   TRIG 88.0 08/21/2013   TRIG 54.0 07/11/2012   Lab Results  Component Value Date  CHOLHDL 3 05/12/2015   CHOLHDL 4 08/21/2013   CHOLHDL 3 07/11/2012   No results found for: LDLDIRECT   Overall a good profile   Patient Active Problem List   Diagnosis Date Noted  . Colon cancer screening 05/20/2015  . Need for hepatitis C screening test 05/20/2015  . Screening for HIV (human immunodeficiency virus) 05/20/2015  . Skin cancer screening 05/20/2015  . Microscopic hematuria 02/05/2015  . GERD (gastroesophageal reflux disease) 10/12/2013  . Insomnia 10/12/2013  . Snoring 08/29/2013  . Obstructive sleep apnea 08/29/2013  . Dyspareunia 08/29/2013  . History of colon polyps 04/13/2011  . Routine general medical examination at a health care facility 12/17/2010  . Essential hypertension 10/27/2009  . STRESS REACTION, ACUTE, WITH EMOTIONAL DISTURBANCE 06/06/2007   Past Medical History  Diagnosis Date  . Colon polyps   . Elevated BP     without hypertension  . Nevus     on left retina-- is watched by opthy   Past Surgical History  Procedure Laterality Date  . Total abdominal hysterectomy     Social History    Substance Use Topics  . Smoking status: Never Smoker   . Smokeless tobacco: None  . Alcohol Use: 0.0 oz/week    0 Standard drinks or equivalent per week     Comment: occ- wine   Family History  Problem Relation Age of Onset  . Coronary artery disease Mother   . Heart failure Mother   . Hypertension Father   . Coronary artery disease Father   . Cancer Father     smoker- lung cancer   . Hypertension Sister   . ADD / ADHD Daughter   . Alcohol abuse Son     in rehabilitation  . ADD / ADHD Son    No Known Allergies Current Outpatient Prescriptions on File Prior to Visit  Medication Sig Dispense Refill  . Multiple Vitamins-Calcium (VIACTIV MULTI-VITAMIN PO) Take 2 tablets by mouth daily.      . NON FORMULARY zzz-quil taking as directed     No current facility-administered medications on file prior to visit.     Review of Systems Review of Systems  Constitutional: Negative for fever, appetite change, fatigue and unexpected weight change.  Eyes: Negative for pain and visual disturbance.  Respiratory: Negative for cough and shortness of breath.   Cardiovascular: Negative for cp or palpitations    Gastrointestinal: Negative for nausea, diarrhea and constipation.  Genitourinary: Negative for urgency and frequency.  Skin: Negative for pallor or rash   Neurological: Negative for weakness, light-headedness, numbness and headaches.  Hematological: Negative for adenopathy. Does not bruise/bleed easily.  Psychiatric/Behavioral: Negative for dysphoric mood. The patient is not nervous/anxious.         Objective:   Physical Exam  Constitutional: She appears well-developed and well-nourished. No distress.  Well appearing Weight loss noted   HENT:  Head: Normocephalic and atraumatic.  Right Ear: External ear normal.  Left Ear: External ear normal.  Nose: Nose normal.  Mouth/Throat: Oropharynx is clear and moist.  Eyes: Conjunctivae and EOM are normal. Pupils are equal, round, and  reactive to light. Right eye exhibits no discharge. Left eye exhibits no discharge. No scleral icterus.  Neck: Normal range of motion. Neck supple. No JVD present. Carotid bruit is not present. No thyromegaly present.  Cardiovascular: Normal rate, regular rhythm, normal heart sounds and intact distal pulses.  Exam reveals no gallop.   Pulmonary/Chest: Effort normal and breath sounds normal. No respiratory distress. She  has no wheezes. She has no rales.  Abdominal: Soft. Bowel sounds are normal. She exhibits no distension and no mass. There is no tenderness.  Musculoskeletal: She exhibits no edema or tenderness.  Lymphadenopathy:    She has no cervical adenopathy.  Neurological: She is alert. She has normal reflexes. No cranial nerve deficit. She exhibits normal muscle tone. Coordination normal.  Skin: Skin is warm and dry. No rash noted. No erythema. No pallor.  Solar lentigines and brown nevi   Psychiatric: She has a normal mood and affect.          Assessment & Plan:   Problem List Items Addressed This Visit      Cardiovascular and Mediastinum   Essential hypertension    bp in fair control at this time  BP Readings from Last 1 Encounters:  05/20/15 126/82   No changes needed Disc lifstyle change with low sodium diet and exercise  Off medication now - lifestyle controlled with wt loss Commended!  Will continue to follow        Other   Colon cancer screening    Ref to GI for colonoscopy      Relevant Orders   Ambulatory referral to Gastroenterology   Need for hepatitis C screening test    Lab today Not high risk      Relevant Orders   Hepatitis C antibody (Completed)   Routine general medical examination at a health care facility - Primary    Reviewed health habits including diet and exercise and skin cancer prevention Reviewed appropriate screening tests for age  Also reviewed health mt list, fam hx and immunization status , as well as social and family history    See HPI Labs reviewed  Weight and blood pressure are excellent Keep up the good work Exercise and eating fish /or taking omega 3 supplement will help HDL cholesterol  Labs look good Lab for HIV and Hep C screen today  Check out up front for your colonoscopy referral and dermatology referral   Use sunscreen!      Screening for HIV (human immunodeficiency virus)    Lab today  Not high risk      Relevant Orders   HIV antibody (with reflex) (Completed)   Skin cancer screening    Ref to dermatology       Relevant Orders   Ambulatory referral to Dermatology

## 2015-05-20 NOTE — Progress Notes (Signed)
Pre visit review using our clinic review tool, if applicable. No additional management support is needed unless otherwise documented below in the visit note. 

## 2015-05-21 LAB — HEPATITIS C ANTIBODY: HCV Ab: NEGATIVE

## 2015-05-21 LAB — HIV ANTIBODY (ROUTINE TESTING W REFLEX): HIV: NONREACTIVE

## 2015-05-22 ENCOUNTER — Encounter: Payer: Self-pay | Admitting: Family Medicine

## 2015-05-22 NOTE — Assessment & Plan Note (Signed)
bp in fair control at this time  BP Readings from Last 1 Encounters:  05/20/15 126/82   No changes needed Disc lifstyle change with low sodium diet and exercise  Off medication now - lifestyle controlled with wt loss Commended!  Will continue to follow

## 2015-05-22 NOTE — Assessment & Plan Note (Signed)
Lab today Not high risk

## 2015-05-22 NOTE — Assessment & Plan Note (Signed)
Ref to dermatology

## 2015-05-22 NOTE — Assessment & Plan Note (Signed)
Reviewed health habits including diet and exercise and skin cancer prevention Reviewed appropriate screening tests for age  Also reviewed health mt list, fam hx and immunization status , as well as social and family history   See HPI Labs reviewed  Weight and blood pressure are excellent Keep up the good work Exercise and eating fish /or taking omega 3 supplement will help HDL cholesterol  Labs look good Lab for HIV and Hep C screen today  Check out up front for your colonoscopy referral and dermatology referral   Use sunscreen!

## 2015-05-22 NOTE — Assessment & Plan Note (Signed)
Lab today  Not high risk

## 2015-05-22 NOTE — Assessment & Plan Note (Signed)
Ref to GI for colonoscopy

## 2015-05-25 ENCOUNTER — Other Ambulatory Visit: Payer: Self-pay | Admitting: Family Medicine

## 2015-07-21 ENCOUNTER — Ambulatory Visit (AMBULATORY_SURGERY_CENTER): Payer: Self-pay | Admitting: *Deleted

## 2015-07-21 VITALS — Ht 62.0 in | Wt 139.0 lb

## 2015-07-21 DIAGNOSIS — Z1211 Encounter for screening for malignant neoplasm of colon: Secondary | ICD-10-CM

## 2015-07-21 NOTE — Progress Notes (Signed)
Patient denies any allergies to eggs or soy. Patient denies any problems with anesthesia/sedation. Patient denies any oxygen use at home and does not take any diet/weight loss medications.  

## 2015-07-28 ENCOUNTER — Encounter: Payer: Self-pay | Admitting: Gastroenterology

## 2015-08-04 ENCOUNTER — Ambulatory Visit (AMBULATORY_SURGERY_CENTER): Payer: 59 | Admitting: Gastroenterology

## 2015-08-04 ENCOUNTER — Encounter: Payer: Self-pay | Admitting: Gastroenterology

## 2015-08-04 VITALS — BP 136/71 | HR 52 | Temp 96.8°F | Resp 13 | Ht 62.0 in | Wt 139.0 lb

## 2015-08-04 DIAGNOSIS — D123 Benign neoplasm of transverse colon: Secondary | ICD-10-CM | POA: Diagnosis not present

## 2015-08-04 DIAGNOSIS — Z1211 Encounter for screening for malignant neoplasm of colon: Secondary | ICD-10-CM | POA: Diagnosis present

## 2015-08-04 MED ORDER — SODIUM CHLORIDE 0.9 % IV SOLN: 500.0000 mL | INTRAVENOUS | Status: DC

## 2015-08-04 NOTE — Patient Instructions (Signed)
YOU HAD AN ENDOSCOPIC PROCEDURE TODAY AT THE [facility]:   Refer to the procedure report that was given to you for any specific questions about what was found during the examination.  If the procedure report does not answer your questions, please call your gastroenterologist to clarify.  If you requested that your care partner not be given the details of your procedure findings, then the procedure report has been included in a sealed envelope for you to review at your convenience later.  YOU SHOULD EXPECT: Some feelings of bloating in the abdomen. Passage of more gas than usual.  Walking can help get rid of the air that was put into your GI tract during the procedure and reduce the bloating. If you had a lower endoscopy (such as a colonoscopy or flexible sigmoidoscopy) you may notice spotting of blood in your stool or on the toilet paper. If you underwent a bowel prep for your procedure, you may not have a normal bowel movement for a few days.  Please Note:  You might notice some irritation and congestion in your nose or some drainage.  This is from the oxygen used during your procedure.  There is no need for concern and it should clear up in a day or so.  SYMPTOMS TO REPORT IMMEDIATELY:   Following lower endoscopy (colonoscopy or flexible sigmoidoscopy):  Excessive amounts of blood in the stool  Significant tenderness or worsening of abdominal pains  Swelling of the abdomen that is new, acute  Fever of 100F or higher   For urgent or emergent issues, a gastroenterologist can be reached at any hour by calling [phone].   DIET: Your first meal following the procedure should be a small meal and then it is ok to progress to your normal diet. Heavy or fried foods are harder to digest and may make you feel nauseous or bloated.  Likewise, meals heavy in dairy and vegetables can increase bloating.  Drink plenty of fluids but you should avoid alcoholic beverages for 24  hours.  ACTIVITY:  You should plan to take it easy for the rest of today and you should NOT DRIVE or use heavy machinery until tomorrow (because of the sedation medicines used during the test).    FOLLOW UP: Our staff will call the number listed on your records the next business day following your procedure to check on you and address any questions or concerns that you may have regarding the information given to you following your procedure. If we do not reach you, we will leave a message.  However, if you are feeling well and you are not experiencing any problems, there is no need to return our call.  We will assume that you have returned to your regular daily activities without incident.  If any biopsies were taken you will be contacted by phone or by letter within the next 1-3 weeks.  Please call us at [phone] if you have not heard about the biopsies in 3 weeks.    SIGNATURES/CONFIDENTIALITY: You and/or your care partner have signed paperwork which will be entered into your electronic medical record.  These signatures attest to the fact that that the information above on your After Visit Summary has been reviewed and is understood.  Full responsibility of the confidentiality of this discharge information lies with you and/or your care-partner. 

## 2015-08-04 NOTE — Progress Notes (Signed)
Report given to PACU RN, vss 

## 2015-08-04 NOTE — Op Note (Addendum)
[facility] Patient Name: Jasmine Oliver Procedure Date: 08/04/2015 9:38 AM MRN: [id number] Endoscopist: Mallie Mussel L. Loletha Carrow , MD Age: 65 Referring MD:  Date of Birth: 08-Aug-1950 Gender: Female Account #: [id number] Procedure:                Colonoscopy Indications:              Screening for colorectal malignant neoplasm Medicines:                Monitored Anesthesia Care Procedure:                Pre-Anesthesia Assessment:                           - Prior to the procedure, a History and Physical                            was performed, and patient medications and                            allergies were reviewed. The patient's tolerance of                            previous anesthesia was also reviewed. The risks                            and benefits of the procedure and the sedation                            options and risks were discussed with the patient.                            All questions were answered, and informed consent                            was obtained. Prior Anticoagulants: The patient has                            taken no previous anticoagulant or antiplatelet                            agents. ASA Grade Assessment: II - A patient with                            mild systemic disease. After reviewing the risks                            and benefits, the patient was deemed in                            satisfactory condition to undergo the procedure.                           After obtaining informed consent, the colonoscope  was passed under direct vision. Throughout the                            procedure, the patient's blood pressure, pulse, and                            oxygen saturations were monitored continuously. The                            Model CF-HQ190L [id number]) scope was introduced                            through the anus and advanced to the the cecum,                            identified by  appendiceal orifice and ileocecal                            valve. The colonoscopy was performed without                            difficulty. The patient tolerated the procedure                            well. The quality of the bowel preparation was                            excellent. The ileocecal valve, appendiceal                            orifice, and rectum were photographed. Scope In: 9:50:24 AM Scope Out: 10:03:42 AM Scope Withdrawal Time: 0 hours 8 minutes 23 seconds  Total Procedure Duration: 0 hours 13 minutes 18 seconds  Findings:                 The perianal and digital rectal examinations were                            normal.                           A 4 mm polyp was found in the proximal transverse                            colon. The polyp was sessile. The polyp was removed                            with a cold snare. Resection and retrieval were                            complete.                           The exam was otherwise without abnormality on  direct and retroflexion views. Complications:            No immediate complications. Estimated Blood Loss:     Estimated blood loss was minimal. Impression:               - One 4 mm polyp in the proximal transverse colon,                            removed with a cold snare. Resected and retrieved.                           - The examination was otherwise normal on direct                            and retroflexion views. Recommendation:           - Patient has a contact number available for                            emergencies. The signs and symptoms of potential                            delayed complications were discussed with the                            patient. Return to normal activities tomorrow.                            Written discharge instructions were provided to the                            patient.                           - Resume previous diet.                            - Continue present medications.                           - Await pathology results.                           - Repeat colonoscopy is recommended for                            surveillance. The colonoscopy date will be                            determined after pathology results from today's                            exam become available for review. Willo Yoon L. Loletha Carrow, MD 08/04/2015 10:11:08 AM This report has been signed electronically.

## 2015-08-04 NOTE — Progress Notes (Signed)
Called to room to assist during endoscopic procedure.  Patient ID and intended procedure confirmed with present staff. Received instructions for my participation in the procedure from the performing physician.  

## 2015-08-05 ENCOUNTER — Telehealth: Payer: Self-pay

## 2015-08-05 NOTE — Telephone Encounter (Signed)
  Follow up Call-  Call back number 08/04/2015  Post procedure Call Back phone  # [phone]  Permission to leave phone message Yes     Patient questions:  Do you have a fever, pain , or abdominal swelling? No. Pain Score  0 *  Have you tolerated food without any problems? Yes.    Have you been able to return to your normal activities? Yes.    Do you have any questions about your discharge instructions: Diet   No. Medications  No. Follow up visit  No.  Do you have questions or concerns about your Care? No.  Actions: * If pain score is 4 or above: No action needed, pain <4.

## 2015-08-08 ENCOUNTER — Encounter: Payer: Self-pay | Admitting: Gastroenterology

## 2016-03-17 ENCOUNTER — Ambulatory Visit: Payer: 59

## 2016-03-18 ENCOUNTER — Other Ambulatory Visit: Payer: Self-pay | Admitting: Family Medicine

## 2016-03-18 DIAGNOSIS — Z1231 Encounter for screening mammogram for malignant neoplasm of breast: Secondary | ICD-10-CM

## 2016-04-23 ENCOUNTER — Ambulatory Visit
Admission: RE | Admit: 2016-04-23 | Discharge: 2016-04-23 | Disposition: A | Discharge status: Home / Self Care | Payer: 59 | Source: Ambulatory Visit | Attending: Family Medicine | Admitting: Family Medicine

## 2016-04-23 DIAGNOSIS — Z1231 Encounter for screening mammogram for malignant neoplasm of breast: Secondary | ICD-10-CM

## 2016-04-26 ENCOUNTER — Other Ambulatory Visit: Payer: Self-pay | Admitting: Family Medicine

## 2016-04-26 DIAGNOSIS — R928 Other abnormal and inconclusive findings on diagnostic imaging of breast: Secondary | ICD-10-CM

## 2016-04-29 ENCOUNTER — Ambulatory Visit
Admission: RE | Admit: 2016-04-29 | Discharge: 2016-04-29 | Disposition: A | Discharge status: Home / Self Care | Payer: 59 | Source: Ambulatory Visit | Attending: Family Medicine | Admitting: Family Medicine

## 2016-04-29 DIAGNOSIS — R928 Other abnormal and inconclusive findings on diagnostic imaging of breast: Secondary | ICD-10-CM

## 2016-05-10 ENCOUNTER — Encounter: Payer: Self-pay | Admitting: Family Medicine

## 2016-05-19 ENCOUNTER — Telehealth: Payer: Self-pay | Admitting: Family Medicine

## 2016-05-19 DIAGNOSIS — Z0000 Encounter for general adult medical examination without abnormal findings: Secondary | ICD-10-CM

## 2016-05-19 NOTE — Telephone Encounter (Signed)
-----   Message from Ellamae Sia sent at 05/18/2016 11:33 AM EDT ----- Regarding: lab orders for Monday 4.16.18 Patient is scheduled for CPX labs, please order future labs, Thanks , Karna Christmas

## 2016-05-31 ENCOUNTER — Other Ambulatory Visit (INDEPENDENT_AMBULATORY_CARE_PROVIDER_SITE_OTHER): Payer: 59

## 2016-05-31 DIAGNOSIS — Z0000 Encounter for general adult medical examination without abnormal findings: Secondary | ICD-10-CM

## 2016-05-31 LAB — CBC WITH DIFFERENTIAL/PLATELET
BASOS ABS: 0.1 10*3/uL (ref 0.0–0.1)
Basophils Relative: 1 % (ref 0.0–3.0)
Eosinophils Absolute: 0.2 10*3/uL (ref 0.0–0.7)
Eosinophils Relative: 3.9 % (ref 0.0–5.0)
HCT: 43.7 % (ref 36.0–46.0)
Hemoglobin: 14.7 g/dL (ref 12.0–15.0)
LYMPHS ABS: 1.7 10*3/uL (ref 0.7–4.0)
LYMPHS PCT: 31.1 % (ref 12.0–46.0)
MCHC: 33.6 g/dL (ref 30.0–36.0)
MCV: 88.6 fl (ref 78.0–100.0)
Monocytes Absolute: 0.3 10*3/uL (ref 0.1–1.0)
Monocytes Relative: 6.5 % (ref 3.0–12.0)
NEUTROS ABS: 3.1 10*3/uL (ref 1.4–7.7)
Neutrophils Relative %: 57.5 % (ref 43.0–77.0)
PLATELETS: 260 10*3/uL (ref 150.0–400.0)
RBC: 4.93 Mil/uL (ref 3.87–5.11)
RDW: 13.6 % (ref 11.5–15.5)
WBC: 5.4 10*3/uL (ref 4.0–10.5)

## 2016-05-31 LAB — COMPREHENSIVE METABOLIC PANEL
ALT: 12 U/L (ref 0–35)
AST: 18 U/L (ref 0–37)
Albumin: 4.1 g/dL (ref 3.5–5.2)
Alkaline Phosphatase: 65 U/L (ref 39–117)
BILIRUBIN TOTAL: 0.5 mg/dL (ref 0.2–1.2)
BUN: 12 mg/dL (ref 6–23)
CO2: 31 meq/L (ref 19–32)
CREATININE: 0.88 mg/dL (ref 0.40–1.20)
Calcium: 9.7 mg/dL (ref 8.4–10.5)
Chloride: 105 mEq/L (ref 96–112)
GFR: 68.91 mL/min (ref >60.00)
Glucose, Bld: 87 mg/dL (ref 70–99)
Potassium: 4.3 mEq/L (ref 3.5–5.1)
Sodium: 140 mEq/L (ref 135–145)
Total Protein: 6.9 g/dL (ref 6.0–8.3)

## 2016-05-31 LAB — LIPID PANEL
Cholesterol: 143 mg/dL (ref 0–200)
HDL: 54.4 mg/dL (ref >39.00)
LDL Cholesterol: 81 mg/dL (ref 0–99)
NONHDL: 88.6
Total CHOL/HDL Ratio: 3
Triglycerides: 39 mg/dL (ref 0.0–149.0)
VLDL: 7.8 mg/dL (ref 0.0–40.0)

## 2016-05-31 LAB — TSH: TSH: 1.19 u[IU]/mL (ref 0.35–4.50)

## 2016-06-16 ENCOUNTER — Ambulatory Visit (INDEPENDENT_AMBULATORY_CARE_PROVIDER_SITE_OTHER): Payer: 59 | Admitting: Family Medicine

## 2016-06-16 ENCOUNTER — Encounter: Payer: Self-pay | Admitting: Family Medicine

## 2016-06-16 VITALS — BP 122/84 | HR 68 | Temp 98.1°F | Ht 62.0 in | Wt 146.2 lb

## 2016-06-16 DIAGNOSIS — I10 Essential (primary) hypertension: Secondary | ICD-10-CM | POA: Diagnosis not present

## 2016-06-16 DIAGNOSIS — Z86010 Personal history of colonic polyps: Secondary | ICD-10-CM | POA: Diagnosis not present

## 2016-06-16 DIAGNOSIS — Z0000 Encounter for general adult medical examination without abnormal findings: Secondary | ICD-10-CM

## 2016-06-16 NOTE — Progress Notes (Signed)
Subjective:    Patient ID: Jasmine Oliver, female    DOB: [birth year], 66 y.o.   MRN: [id number]  HPI Here for health maintenance exam and to review chronic medical problems    Has had a cold since Sunday  No fever  Nasal symptoms / stuffy and runny nose  No hx of allergies   Otherwise feeling fine   Wt Readings from Last 3 Encounters:  06/16/16 146 lb 4 oz (66.3 kg)  08/04/15 139 lb (63 kg)  07/21/15 139 lb (63 kg)  eats fairly healthy (she gained some weight and then got back on track) -thinks she is addicted to sugar  Exercise - regularly  bmi 26.5 Working on diet changes!  Hep C/ HIV screening -utd  Mammogram 3/18-needed additional views that were normal (this is the 3rd time)  Self breast exam -no lumps   Pap/gyn care 11/16 nl pap at gyn Had a hysterectomy   Zoster vaccine - had shingles when she was 19 and zostavax  Flu vaccine forgot this year    Colonoscopy/ screening 6/17- adenoma -? 5 y recall   Dermatologist - removed a pre malignant ([facility])  dexa 12/16 normal at gyn No falls or fractures    Flu shot - did not get this season  Tetanus shot 1/14 zostavax 4/16   bp is stable today  No cp or palpitations or headaches or edema  No medications currently  BP Readings from Last 3 Encounters:  06/16/16 122/84  08/04/15 136/71  05/20/15 126/82     Results for orders placed or performed in visit on 05/31/16  CBC with Differential/Platelet  Result Value Ref Range   WBC 5.4 4.0 - 10.5 K/uL   RBC 4.93 3.87 - 5.11 Mil/uL   Hemoglobin 14.7 12.0 - 15.0 g/dL   HCT 43.7 36.0 - 46.0 %   MCV 88.6 78.0 - 100.0 fl   MCHC 33.6 30.0 - 36.0 g/dL   RDW 13.6 11.5 - 15.5 %   Platelets 260.0 150.0 - 400.0 K/uL   Neutrophils Relative % 57.5 43.0 - 77.0 %   Lymphocytes Relative 31.1 12.0 - 46.0 %   Monocytes Relative 6.5 3.0 - 12.0 %   Eosinophils Relative 3.9 0.0 - 5.0 %   Basophils Relative 1.0 0.0 - 3.0 %   Neutro Abs 3.1 1.4 - 7.7 K/uL   Lymphs Abs 1.7  0.7 - 4.0 K/uL   Monocytes Absolute 0.3 0.1 - 1.0 K/uL   Eosinophils Absolute 0.2 0.0 - 0.7 K/uL   Basophils Absolute 0.1 0.0 - 0.1 K/uL  Comprehensive metabolic panel  Result Value Ref Range   Sodium 140 135 - 145 mEq/L   Potassium 4.3 3.5 - 5.1 mEq/L   Chloride 105 96 - 112 mEq/L   CO2 31 19 - 32 mEq/L   Glucose, Bld 87 70 - 99 mg/dL   BUN 12 6 - 23 mg/dL   Creatinine, Ser 0.88 0.40 - 1.20 mg/dL   Total Bilirubin 0.5 0.2 - 1.2 mg/dL   Alkaline Phosphatase 65 39 - 117 U/L   AST 18 0 - 37 U/L   ALT 12 0 - 35 U/L   Total Protein 6.9 6.0 - 8.3 g/dL   Albumin 4.1 3.5 - 5.2 g/dL   Calcium 9.7 8.4 - 10.5 mg/dL   GFR 68.91 >60.00 mL/min  Lipid panel  Result Value Ref Range   Cholesterol 143 0 - 200 mg/dL   Triglycerides 39.0 0.0 - 149.0 mg/dL   HDL  54.40 >39.00 mg/dL   VLDL 7.8 0.0 - 40.0 mg/dL   LDL Cholesterol 81 0 - 99 mg/dL   Total CHOL/HDL Ratio 3    NonHDL 88.60   TSH  Result Value Ref Range   TSH 1.19 0.35 - 4.50 uIU/mL    Labs look good  Great cholesterol profile   Pt saw a nutritionist to review her diet  Questions about supplements   Has concerns about her memory Not as good as it used to be    Patient Active Problem List   Diagnosis Date Noted  . Colon cancer screening 05/20/2015  . Need for hepatitis C screening test 05/20/2015  . Screening for HIV (human immunodeficiency virus) 05/20/2015  . Skin cancer screening 05/20/2015  . Microscopic hematuria 02/05/2015  . GERD (gastroesophageal reflux disease) 10/12/2013  . Insomnia 10/12/2013  . Snoring 08/29/2013  . Obstructive sleep apnea 08/29/2013  . Dyspareunia 08/29/2013  . History of colon polyps 04/13/2011  . Routine general medical examination at a health care facility 12/17/2010  . Essential hypertension 10/27/2009  . STRESS REACTION, ACUTE, WITH EMOTIONAL DISTURBANCE 06/06/2007   Past Medical History:  Diagnosis Date  . Colon polyps   . Elevated BP    without hypertension  . Nevus    on left  retina-- is watched by opthy   Past Surgical History:  Procedure Laterality Date  . CESAREAN SECTION    . COLONOSCOPY  2005   in [place]  . TOTAL ABDOMINAL HYSTERECTOMY     Social History  Substance Use Topics  . Smoking status: Never Smoker  . Smokeless tobacco: Never Used  . Alcohol use 0.0 oz/week     Comment: wine weekly   Family History  Problem Relation Age of Onset  . Coronary artery disease Mother   . Heart failure Mother   . Hypertension Father   . Coronary artery disease Father   . Cancer Father     smoker- lung cancer   . Hypertension Sister   . ADD / ADHD Daughter   . Alcohol abuse Son     in rehabilitation  . ADD / ADHD Son   . Colon cancer Neg Hx    No Known Allergies No current outpatient prescriptions on file prior to visit.   No current facility-administered medications on file prior to visit.     Review of Systems    Review of Systems  Constitutional: Negative for fever, appetite change, fatigue and unexpected weight change.  Eyes: Negative for pain and visual disturbance.  Respiratory: Negative for cough and shortness of breath.   Cardiovascular: Negative for cp or palpitations    Gastrointestinal: Negative for nausea, diarrhea and constipation.  Genitourinary: Negative for urgency and frequency.  Skin: Negative for pallor or rash   Neurological: Negative for weakness, light-headedness, numbness and headaches.  Hematological: Negative for adenopathy. Does not bruise/bleed easily.  Psychiatric/Behavioral: Negative for dysphoric mood. The patient is not nervous/anxious.  pos for pt concerns about short term memory / neg for problems concentrating or confusion or getting lost     Objective:   Physical Exam  Constitutional: She appears well-developed and well-nourished. No distress.  Well appearing  HENT:  Head: Normocephalic and atraumatic.  Right Ear: External ear normal.  Left Ear: External ear normal.  Mouth/Throat: Oropharynx is clear and  moist.  Nares are injected and congested  Some clear pnd  Eyes: Conjunctivae and EOM are normal. Pupils are equal, round, and reactive to light. No scleral icterus.  Neck: Normal range of motion. Neck supple. No JVD present. Carotid bruit is not present. No thyromegaly present.  Cardiovascular: Normal rate, regular rhythm, normal heart sounds and intact distal pulses.  Exam reveals no gallop.   Pulmonary/Chest: Effort normal and breath sounds normal. No respiratory distress. She has no wheezes. She exhibits no tenderness.  Abdominal: Soft. Bowel sounds are normal. She exhibits no distension, no abdominal bruit and no mass. There is no tenderness.  Genitourinary: No breast swelling, tenderness, discharge or bleeding.  Genitourinary Comments: Breast exam: No mass, nodules, thickening, tenderness, bulging, retraction, inflamation, nipple discharge or skin changes noted.  No axillary or clavicular LA.      Musculoskeletal: Normal range of motion. She exhibits no edema or tenderness.  Lymphadenopathy:    She has no cervical adenopathy.  Neurological: She is alert. She has normal reflexes. No cranial nerve deficit. She exhibits normal muscle tone. Coordination normal.  Skin: Skin is warm and dry. No rash noted. No erythema. No pallor.  Solar lentigines and some skin tags   Psychiatric: She has a normal mood and affect. Cognition and memory are normal.  Nl affect Mentally sharp          Assessment & Plan:   Problem List Items Addressed This Visit      Cardiovascular and Mediastinum   Essential hypertension - Primary    bp in fair control at this time  BP Readings from Last 1 Encounters:  06/16/16 122/84   No changes needed Disc lifstyle change with low sodium diet and exercise  Labs reviewed         Other   History of colon polyps    Colonoscopy 2016-adenoma  Pt will call GI office to confirm 5 y recall       Routine general medical examination at a health care facility     Reviewed health habits including diet and exercise and skin cancer prevention Reviewed appropriate screening tests for age  Also reviewed health mt list, fam hx and immunization status , as well as social and family history   See HPI Labs reviewed  Healthy habits  Has viral uri-disc sympt care  Disc Shingrix vaccine  Pt will call GI to double check on recall date  Continue derm visits  Reminded to get flu shot each season  Pt has memory concerns-will return for MMS exam and further eval (mentally sharp today)

## 2016-06-16 NOTE — Patient Instructions (Addendum)
Get a flu shot every flu season in the fall   Processed foods with added sugar and sweets are not great for you  It takes about 2 weeks to stop craving sugar and sweets Get your carbs from the produce department  Artificial sweeteners can cause sugar craving   If you are interested in the new shingles vaccine (Shingrix) - call your insurance to check on coverage,( you should not get it within 1 month of other vaccines) , then call us for a prescription  for it to take to a pharmacy that gives the shot , or make a nurse visit to get it here depending on your coverage  Try to get 1200-1500 mg of calcium per day with at least 1000 iu of vitamin D - for bone health

## 2016-06-16 NOTE — Progress Notes (Signed)
Pre visit review using our clinic review tool, if applicable. No additional management support is needed unless otherwise documented below in the visit note. 

## 2016-06-17 NOTE — Assessment & Plan Note (Signed)
Reviewed health habits including diet and exercise and skin cancer prevention Reviewed appropriate screening tests for age  Also reviewed health mt list, fam hx and immunization status , as well as social and family history   See HPI Labs reviewed  Healthy habits  Has viral uri-disc sympt care  Disc Shingrix vaccine  Pt will call GI to double check on recall date  Continue derm visits  Reminded to get flu shot each season  Pt has memory concerns-will return for MMS exam and further eval (mentally sharp today)

## 2016-06-17 NOTE — Assessment & Plan Note (Signed)
Colonoscopy 2016-adenoma  Pt will call GI office to confirm 5 y recall

## 2016-06-17 NOTE — Assessment & Plan Note (Signed)
bp in fair control at this time  BP Readings from Last 1 Encounters:  06/16/16 122/84   No changes needed Disc lifstyle change with low sodium diet and exercise  Labs reviewed

## 2016-06-23 ENCOUNTER — Encounter: Payer: Self-pay | Admitting: Family Medicine

## 2016-06-23 ENCOUNTER — Ambulatory Visit (INDEPENDENT_AMBULATORY_CARE_PROVIDER_SITE_OTHER): Payer: 59 | Admitting: Family Medicine

## 2016-06-23 VITALS — BP 126/64 | HR 78 | Temp 97.8°F | Ht 62.0 in | Wt 150.8 lb

## 2016-06-23 DIAGNOSIS — R413 Other amnesia: Secondary | ICD-10-CM | POA: Diagnosis not present

## 2016-06-23 NOTE — Patient Instructions (Addendum)
For good brain health Exercise  Keep sleep habits  Stay hydrated (64 oz of fluids -mostly water) per day  Socialize as much as possible  Avoid excessive drugs and alcohol  Read  Do math/ finances/puzzles   Avoid too much multi tasking  Avoid getting overwhelmed as much as you can   Meditation can really help cognition (and mindfulness helps every day mental and physical health) Yoga also   The author Heron Nay -- many books and audio books on meditation and mindfulness

## 2016-06-23 NOTE — Progress Notes (Signed)
Pre visit review using our clinic review tool, if applicable. No additional management support is needed unless otherwise documented below in the visit note. 

## 2016-06-23 NOTE — Progress Notes (Signed)
Subjective:    Patient ID: Jasmine Oliver, female    DOB: [birth year], 66 y.o.   MRN: [id number]  HPI Here for evaluation of memory concerns/issues  She is worried about early signs of dementia   Misplaces items very frequently (lost keys in and outside the house)  Has a hard time re tracing her steps  (she is used to easily memorizing things and not having to make lists)-this has changed  Unsure if she can adapt with lifestyle change   She does stay busy Multi tasks a lot  Used to working long hours and having 4 kids Now retired 3 years   Stress comes and goes  Personality is more uptight than laid back - likes to stay on track  Perfectionist and detail oriented   Has a family hx of ADD (2 of her kids have it however)  She was a Neurosurgeon and straight A student  Had to work very hard for her grades    MMS exam performed today- 30/30 score -scanned into the chart including clock draw   Patient Active Problem List   Diagnosis Date Noted  . Memory changes 06/23/2016  . Colon cancer screening 05/20/2015  . Need for hepatitis C screening test 05/20/2015  . Screening for HIV (human immunodeficiency virus) 05/20/2015  . Skin cancer screening 05/20/2015  . Microscopic hematuria 02/05/2015  . GERD (gastroesophageal reflux disease) 10/12/2013  . Insomnia 10/12/2013  . Snoring 08/29/2013  . Obstructive sleep apnea 08/29/2013  . Dyspareunia 08/29/2013  . History of colon polyps 04/13/2011  . Routine general medical examination at a health care facility 12/17/2010  . Essential hypertension 10/27/2009  . STRESS REACTION, ACUTE, WITH EMOTIONAL DISTURBANCE 06/06/2007   Past Medical History:  Diagnosis Date  . Colon polyps   . Elevated BP    without hypertension  . Nevus    on left retina-- is watched by opthy   Past Surgical History:  Procedure Laterality Date  . CESAREAN SECTION    . COLONOSCOPY  2005   in [place]  . TOTAL ABDOMINAL HYSTERECTOMY     Social History    Substance Use Topics  . Smoking status: Never Smoker  . Smokeless tobacco: Never Used  . Alcohol use 0.0 oz/week     Comment: wine weekly   Family History  Problem Relation Age of Onset  . Coronary artery disease Mother   . Heart failure Mother   . Hypertension Father   . Coronary artery disease Father   . Cancer Father     smoker- lung cancer   . Hypertension Sister   . ADD / ADHD Daughter   . Alcohol abuse Son     in rehabilitation  . ADD / ADHD Son   . Colon cancer Neg Hx    No Known Allergies No current outpatient prescriptions on file prior to visit.   No current facility-administered medications on file prior to visit.      Review of Systems Review of Systems  Constitutional: Negative for fever, appetite change, fatigue and unexpected weight change.  Eyes: Negative for pain and visual disturbance.  Respiratory: Negative for cough and shortness of breath.   Cardiovascular: Negative for cp or palpitations    Gastrointestinal: Negative for nausea, diarrhea and constipation.  Genitourinary: Negative for urgency and frequency.  Skin: Negative for pallor or rash   Neurological: Negative for weakness, light-headedness, numbness and headaches.  Hematological: Negative for adenopathy. Does not bruise/bleed easily.  Psychiatric/Behavioral: Negative for dysphoric mood.  The patient is not nervous/anxious.  pos for misplacing items and small memory changes, pos for occ trouble concentrating        Objective:   Physical Exam  Constitutional: She appears well-developed and well-nourished. No distress.  Well appearing  HENT:  Head: Normocephalic and atraumatic.  Eyes: Conjunctivae and EOM are normal.  Neurological: She is alert. She displays no tremor. No cranial nerve deficit. She exhibits normal muscle tone. Coordination and gait normal.  Skin: Skin is warm and dry.  Psychiatric: She has a normal mood and affect. Her speech is normal and behavior is normal. Judgment and  thought content normal. Her mood appears not anxious. Her affect is not blunt. Cognition and memory are normal. She does not exhibit a depressed mood.  Pleasant and talkative Mentally sharp Perfect 30/30 score on Folstein mini mental status exam today  Also nl clock draw   Attentive and engaged          Assessment & Plan:   Problem List Items Addressed This Visit      Other   Memory changes    Pt notices some issues with short term memory - suspect nl for age Disc red flags for memory loss/cog change  MMS (Folstein) score 30 with perfect clock draw  Mentally sharp  No c/o of anx or depression Busy schedule  Recommendations made re: good mental health/memory care -see AVS including socialization/exercise  Will follow over time  >25 minutes spent in face to face time with patient, >50% spent in counselling or coordination of care

## 2016-06-23 NOTE — Assessment & Plan Note (Addendum)
Pt notices some issues with short term memory - suspect nl for age Disc red flags for memory loss/cog change  MMS (Folstein) score 30 with perfect clock draw  Mentally sharp  No c/o of anx or depression Busy schedule  Recommendations made re: good mental health/memory care -see AVS including socialization/exercise  Will follow over time  >25 minutes spent in face to face time with patient, >50% spent in counselling or coordination of care

## 2016-10-01 ENCOUNTER — Ambulatory Visit (INDEPENDENT_AMBULATORY_CARE_PROVIDER_SITE_OTHER): Payer: 59 | Admitting: Family Medicine

## 2016-10-01 ENCOUNTER — Encounter: Payer: Self-pay | Admitting: Family Medicine

## 2016-10-01 VITALS — BP 122/70 | HR 52 | Temp 98.0°F | Ht 62.0 in | Wt 150.5 lb

## 2016-10-01 DIAGNOSIS — B354 Tinea corporis: Secondary | ICD-10-CM

## 2016-10-01 MED ORDER — KETOCONAZOLE 2 % EX CREA
1.0000 | TOPICAL_CREAM | Freq: Every day | CUTANEOUS | 0 refills | Status: DC
Start: 2016-10-01 — End: 2016-10-21

## 2016-10-01 NOTE — Progress Notes (Signed)
Subjective:    Patient ID: Jasmine Oliver, female    DOB: [birth year], 66 y.o.   MRN: [id number]  HPI Here for a spot on the R side of her face   Red area with central clearing  Is slt raised around the edges Just started itching  Showed up Sunday  Husband had a similar "circle" on his arm   Uses a lot of sunscreen  Washes face with soap (arucula)   No animals in the house   No she has had a few insect bites   She had been in the mt the week before/ outdoors a lot   Patient Active Problem List   Diagnosis Date Noted  . Ringworm of body 10/01/2016  . Memory changes 06/23/2016  . Colon cancer screening 05/20/2015  . Need for hepatitis C screening test 05/20/2015  . Screening for HIV (human immunodeficiency virus) 05/20/2015  . Skin cancer screening 05/20/2015  . Microscopic hematuria 02/05/2015  . GERD (gastroesophageal reflux disease) 10/12/2013  . Insomnia 10/12/2013  . Snoring 08/29/2013  . Obstructive sleep apnea 08/29/2013  . Dyspareunia 08/29/2013  . History of colon polyps 04/13/2011  . Routine general medical examination at a health care facility 12/17/2010  . Essential hypertension 10/27/2009  . STRESS REACTION, ACUTE, WITH EMOTIONAL DISTURBANCE 06/06/2007   Past Medical History:  Diagnosis Date  . Colon polyps   . Elevated BP    without hypertension  . Nevus    on left retina-- is watched by opthy   Past Surgical History:  Procedure Laterality Date  . CESAREAN SECTION    . COLONOSCOPY  2005   in [place]  . TOTAL ABDOMINAL HYSTERECTOMY     Social History  Substance Use Topics  . Smoking status: Never Smoker  . Smokeless tobacco: Never Used  . Alcohol use 0.0 oz/week     Comment: wine weekly   Family History  Problem Relation Age of Onset  . Coronary artery disease Mother   . Heart failure Mother   . Hypertension Father   . Coronary artery disease Father   . Cancer Father        smoker- lung cancer   . Hypertension Sister   . ADD / ADHD  Daughter   . Alcohol abuse Son        in rehabilitation  . ADD / ADHD Son   . Colon cancer Neg Hx    No Known Allergies No current outpatient prescriptions on file prior to visit.   No current facility-administered medications on file prior to visit.     Review of Systems Review of Systems  Constitutional: Negative for fever, appetite change, fatigue and unexpected weight change.  Eyes: Negative for pain and visual disturbance.  Respiratory: Negative for cough and shortness of breath.   Cardiovascular: Negative for cp or palpitations    Gastrointestinal: Negative for nausea, diarrhea and constipation.  Genitourinary: Negative for urgency and frequency.  Skin: Negative for pallor or rash  pos for skin lesion on R face Neurological: Negative for weakness, light-headedness, numbness and headaches.  Hematological: Negative for adenopathy. Does not bruise/bleed easily.  Psychiatric/Behavioral: Negative for dysphoric mood. The patient is not nervous/anxious.         Objective:   Physical Exam  Constitutional: She appears well-developed and well-nourished. No distress.  HENT:  Head: Normocephalic and atraumatic.  Mouth/Throat: No oropharyngeal exudate.  Eyes: Pupils are equal, round, and reactive to light. Conjunctivae and EOM are normal. Right eye exhibits no discharge. Left  eye exhibits no discharge. No scleral icterus.  Neck: Normal range of motion. Neck supple.  Musculoskeletal: She exhibits no edema or tenderness.  Lymphadenopathy:    She has no cervical adenopathy.  Neurological: She is alert. No cranial nerve deficit.  Skin: Skin is warm and dry. No pallor.  1.5 cm circular lesion on R cheek Erythematous raised border with central clearing  Scant scale Consistent with ringworm   Overall tanned Solar lentigines diffusely  Few small healing insect bites on L abdomen  Palms/soles clear   Psychiatric: She has a normal mood and affect.          Assessment & Plan:    Problem List Items Addressed This Visit      Musculoskeletal and Integument   Ringworm of body    1.5 cm diameter circular area on R cheek  (husband may have one on hand as well)  tx with ketoconazole cream 2% Keep clean and dry  Update if not starting to improve in several weeks or if worsening        Relevant Medications   ketoconazole (NIZORAL) 2 % cream

## 2016-10-01 NOTE — Assessment & Plan Note (Signed)
1.5 cm diameter circular area on R cheek  (husband may have one on hand as well)  tx with ketoconazole cream 2% Keep clean and dry  Update if not starting to improve in several weeks or if worsening

## 2016-10-01 NOTE — Patient Instructions (Signed)
Keep area clean and dry  Use the ketoconazole cream once daily in the evening  If not improved in 1-2 weeks let us know

## 2016-10-21 ENCOUNTER — Other Ambulatory Visit: Payer: Self-pay | Admitting: Family Medicine

## 2016-10-21 MED ORDER — KETOCONAZOLE 2 % EX CREA: 1.0000 "application " | TOPICAL_CREAM | Freq: Every day | CUTANEOUS | 1 refills | Status: DC

## 2016-10-21 NOTE — Telephone Encounter (Signed)
appt was on 10/01/16 addressing the issue pt needed this Rx for. Last filled on 10/01/16 #15g with 0 refills, please advise

## 2016-10-21 NOTE — Telephone Encounter (Signed)
Refilled times 2

## 2017-01-04 ENCOUNTER — Ambulatory Visit: Payer: 59

## 2017-01-20 ENCOUNTER — Ambulatory Visit (INDEPENDENT_AMBULATORY_CARE_PROVIDER_SITE_OTHER): Payer: 59

## 2017-01-20 DIAGNOSIS — Z23 Encounter for immunization: Secondary | ICD-10-CM | POA: Diagnosis not present

## 2017-04-29 ENCOUNTER — Other Ambulatory Visit: Payer: Self-pay | Admitting: Family Medicine

## 2017-04-29 DIAGNOSIS — Z1231 Encounter for screening mammogram for malignant neoplasm of breast: Secondary | ICD-10-CM

## 2017-05-18 ENCOUNTER — Ambulatory Visit
Admission: RE | Admit: 2017-05-18 | Discharge: 2017-05-18 | Disposition: A | Discharge status: Home / Self Care | Payer: 59 | Source: Ambulatory Visit | Attending: Family Medicine | Admitting: Family Medicine

## 2017-05-18 DIAGNOSIS — Z1231 Encounter for screening mammogram for malignant neoplasm of breast: Secondary | ICD-10-CM

## 2017-06-08 ENCOUNTER — Telehealth: Payer: Self-pay | Admitting: Family Medicine

## 2017-06-08 DIAGNOSIS — I10 Essential (primary) hypertension: Secondary | ICD-10-CM

## 2017-06-08 NOTE — Telephone Encounter (Signed)
-----   Message from Ellamae Sia sent at 06/07/2017 12:49 PM EDT ----- Regarding: Lab orders for Wednesday, 5.1.19 Patient is scheduled for CPX labs, please order future labs, Thanks , Karna Christmas

## 2017-06-15 ENCOUNTER — Other Ambulatory Visit (INDEPENDENT_AMBULATORY_CARE_PROVIDER_SITE_OTHER): Payer: 59

## 2017-06-15 DIAGNOSIS — I10 Essential (primary) hypertension: Secondary | ICD-10-CM

## 2017-06-15 LAB — COMPREHENSIVE METABOLIC PANEL
ALK PHOS: 61 U/L (ref 39–117)
ALT: 11 U/L (ref 0–35)
AST: 15 U/L (ref 0–37)
Albumin: 3.6 g/dL (ref 3.5–5.2)
BILIRUBIN TOTAL: 0.2 mg/dL (ref 0.2–1.2)
BUN: 21 mg/dL (ref 6–23)
CALCIUM: 9.3 mg/dL (ref 8.4–10.5)
CHLORIDE: 107 meq/L (ref 96–112)
CO2: 29 mEq/L (ref 19–32)
CREATININE: 0.76 mg/dL (ref 0.40–1.20)
GFR: 81.35 mL/min (ref >60.00)
Glucose, Bld: 89 mg/dL (ref 70–99)
Potassium: 4.2 mEq/L (ref 3.5–5.1)
SODIUM: 141 meq/L (ref 135–145)
TOTAL PROTEIN: 6.4 g/dL (ref 6.0–8.3)

## 2017-06-15 LAB — LIPID PANEL
CHOLESTEROL: 153 mg/dL (ref 0–200)
HDL: 53.5 mg/dL (ref >39.00)
LDL Cholesterol: 91 mg/dL (ref 0–99)
NonHDL: 99.68
TRIGLYCERIDES: 42 mg/dL (ref 0.0–149.0)
Total CHOL/HDL Ratio: 3
VLDL: 8.4 mg/dL (ref 0.0–40.0)

## 2017-06-15 LAB — CBC WITH DIFFERENTIAL/PLATELET
BASOS ABS: 0.1 10*3/uL (ref 0.0–0.1)
BASOS PCT: 1 % (ref 0.0–3.0)
Eosinophils Absolute: 0.3 10*3/uL (ref 0.0–0.7)
Eosinophils Relative: 4.3 % (ref 0.0–5.0)
HEMATOCRIT: 40.8 % (ref 36.0–46.0)
HEMOGLOBIN: 14 g/dL (ref 12.0–15.0)
LYMPHS PCT: 40.8 % (ref 12.0–46.0)
Lymphs Abs: 2.5 10*3/uL (ref 0.7–4.0)
MCHC: 34.2 g/dL (ref 30.0–36.0)
MCV: 88.9 fl (ref 78.0–100.0)
MONO ABS: 0.4 10*3/uL (ref 0.1–1.0)
Monocytes Relative: 6.5 % (ref 3.0–12.0)
Neutro Abs: 2.9 10*3/uL (ref 1.4–7.7)
Neutrophils Relative %: 47.4 % (ref 43.0–77.0)
Platelets: 226 10*3/uL (ref 150.0–400.0)
RBC: 4.59 Mil/uL (ref 3.87–5.11)
RDW: 13.7 % (ref 11.5–15.5)
WBC: 6.1 10*3/uL (ref 4.0–10.5)

## 2017-06-15 LAB — TSH: TSH: 2.45 u[IU]/mL (ref 0.35–4.50)

## 2017-06-20 ENCOUNTER — Ambulatory Visit (INDEPENDENT_AMBULATORY_CARE_PROVIDER_SITE_OTHER): Payer: 59 | Admitting: Family Medicine

## 2017-06-20 ENCOUNTER — Encounter: Payer: Self-pay | Admitting: Family Medicine

## 2017-06-20 VITALS — BP 124/78 | HR 54 | Temp 97.6°F | Ht 62.0 in | Wt 163.0 lb

## 2017-06-20 DIAGNOSIS — R3129 Other microscopic hematuria: Secondary | ICD-10-CM

## 2017-06-20 DIAGNOSIS — I10 Essential (primary) hypertension: Secondary | ICD-10-CM

## 2017-06-20 DIAGNOSIS — Z0000 Encounter for general adult medical examination without abnormal findings: Secondary | ICD-10-CM

## 2017-06-20 DIAGNOSIS — Z0001 Encounter for general adult medical examination with abnormal findings: Secondary | ICD-10-CM

## 2017-06-20 LAB — POC URINALSYSI DIPSTICK (AUTOMATED)
BILIRUBIN UA: NEGATIVE
Blood, UA: 200
Glucose, UA: NEGATIVE
KETONES UA: NEGATIVE
Leukocytes, UA: NEGATIVE
Nitrite, UA: NEGATIVE
PROTEIN UA: NEGATIVE
Urobilinogen, UA: 0.2 E.U./dL
pH, UA: 6 (ref 5.0–8.0)

## 2017-06-20 NOTE — Assessment & Plan Note (Signed)
bp in fair control at this time  BP Readings from Last 1 Encounters:  06/20/17 124/78   No changes needed Disc lifstyle change with low sodium diet and exercise  Labs reviewed

## 2017-06-20 NOTE — Assessment & Plan Note (Signed)
Reviewed health habits including diet and exercise and skin cancer prevention Reviewed appropriate screening tests for age  Also reviewed health mt list, fam hx and immunization status , as well as social and family history   See HPI Labs reviewed  Some c/o of overactive bladder symptoms  Disc diet/exercise for wt loss  Getting over caregiver stress and grief  Disc ca and D for bone health

## 2017-06-20 NOTE — Patient Instructions (Signed)
Take care of yourself   Labs look good   Get back on track with diet and exercise after you can get some rest   Try to get 1200-1500 mg of calcium per day with at least 1000 iu of vitamin D - for bone health

## 2017-06-20 NOTE — Assessment & Plan Note (Signed)
UA today  Ongoing   Some symptoms of overactive bladder  May want to try medication in the future

## 2017-06-20 NOTE — Progress Notes (Signed)
Subjective:    Patient ID: Jasmine Oliver, female    DOB: [birth year], 67 y.o.   MRN: [id number]  HPI Here for health maintenance exam and to review chronic medical problems    Was caring for MIL with ALS and she died a week ago   Doing pretty good overall   Urinates more frequently with age (overactive bladder)- worse during the day  No leaking as a rule  Baseline hematuria microscopic  Does not feel like she has an infection  No uti symptoms like burning  Would like to try medication for overactive bladder   Wt Readings from Last 3 Encounters:  06/20/17 163 lb (73.9 kg)  10/01/16 150 lb 8 oz (68.3 kg)  06/23/16 150 lb 12 oz (68.4 kg)  picked up weight again but blood work did not change  In the past she cut out sugar /ate fiber and protein  Ready to get back on track  Will be able to start exercising again  29.81 kg/m   Flu shot 12/18  Pap 11/16 nl at gyn Had a hysterectomy    Mammogram 4/19-fine  Self breast exam- no lumps   Colonoscopy 6/17 5 y recall for adenoma   Tetanus shot 1/14  zostavax 4/16  (also had shingles at age 84)  dexa 12/16 nl at gyn  bp is stable today  No cp or palpitations or headaches or edema  No medicines  BP Readings from Last 3 Encounters:  06/20/17 124/78  10/01/16 122/70  06/23/16 126/64      Cholesterol  Lab Results  Component Value Date   CHOL 153 06/15/2017   CHOL 143 05/31/2016   CHOL 127 05/12/2015   Lab Results  Component Value Date   HDL 53.50 06/15/2017   HDL 54.40 05/31/2016   HDL 40.80 05/12/2015   Lab Results  Component Value Date   LDLCALC 91 06/15/2017   LDLCALC 81 05/31/2016   LDLCALC 76 05/12/2015   Lab Results  Component Value Date   TRIG 42.0 06/15/2017   TRIG 39.0 05/31/2016   TRIG 51.0 05/12/2015   Lab Results  Component Value Date   CHOLHDL 3 06/15/2017   CHOLHDL 3 05/31/2016   CHOLHDL 3 05/12/2015   No results found for: LDLDIRECT   Other labs Results for orders placed or  performed in visit on 06/15/17  TSH  Result Value Ref Range   TSH 2.45 0.35 - 4.50 uIU/mL  Lipid panel  Result Value Ref Range   Cholesterol 153 0 - 200 mg/dL   Triglycerides 42.0 0.0 - 149.0 mg/dL   HDL 53.50 >39.00 mg/dL   VLDL 8.4 0.0 - 40.0 mg/dL   LDL Cholesterol 91 0 - 99 mg/dL   Total CHOL/HDL Ratio 3    NonHDL 99.68   Comprehensive metabolic panel  Result Value Ref Range   Sodium 141 135 - 145 mEq/L   Potassium 4.2 3.5 - 5.1 mEq/L   Chloride 107 96 - 112 mEq/L   CO2 29 19 - 32 mEq/L   Glucose, Bld 89 70 - 99 mg/dL   BUN 21 6 - 23 mg/dL   Creatinine, Ser 0.76 0.40 - 1.20 mg/dL   Total Bilirubin 0.2 0.2 - 1.2 mg/dL   Alkaline Phosphatase 61 39 - 117 U/L   AST 15 0 - 37 U/L   ALT 11 0 - 35 U/L   Total Protein 6.4 6.0 - 8.3 g/dL   Albumin 3.6 3.5 - 5.2 g/dL   Calcium 9.3 8.4 -  10.5 mg/dL   GFR 81.35 >60.00 mL/min  CBC with Differential/Platelet  Result Value Ref Range   WBC 6.1 4.0 - 10.5 K/uL   RBC 4.59 3.87 - 5.11 Mil/uL   Hemoglobin 14.0 12.0 - 15.0 g/dL   HCT 40.8 36.0 - 46.0 %   MCV 88.9 78.0 - 100.0 fl   MCHC 34.2 30.0 - 36.0 g/dL   RDW 13.7 11.5 - 15.5 %   Platelets 226.0 150.0 - 400.0 K/uL   Neutrophils Relative % 47.4 43.0 - 77.0 %   Lymphocytes Relative 40.8 12.0 - 46.0 %   Monocytes Relative 6.5 3.0 - 12.0 %   Eosinophils Relative 4.3 0.0 - 5.0 %   Basophils Relative 1.0 0.0 - 3.0 %   Neutro Abs 2.9 1.4 - 7.7 K/uL   Lymphs Abs 2.5 0.7 - 4.0 K/uL   Monocytes Absolute 0.4 0.1 - 1.0 K/uL   Eosinophils Absolute 0.3 0.0 - 0.7 K/uL   Basophils Absolute 0.1 0.0 - 0.1 K/uL     Patient Active Problem List   Diagnosis Date Noted  . Ringworm of body 10/01/2016  . Memory changes 06/23/2016  . Colon cancer screening 05/20/2015  . Need for hepatitis C screening test 05/20/2015  . Screening for HIV (human immunodeficiency virus) 05/20/2015  . Skin cancer screening 05/20/2015  . Microscopic hematuria 02/05/2015  . GERD (gastroesophageal reflux disease)  10/12/2013  . Insomnia 10/12/2013  . Snoring 08/29/2013  . Obstructive sleep apnea 08/29/2013  . Dyspareunia 08/29/2013  . History of colon polyps 04/13/2011  . Routine general medical examination at a health care facility 12/17/2010  . Essential hypertension 10/27/2009  . STRESS REACTION, ACUTE, WITH EMOTIONAL DISTURBANCE 06/06/2007   Past Medical History:  Diagnosis Date  . Colon polyps   . Elevated BP    without hypertension  . Nevus    on left retina-- is watched by opthy   Past Surgical History:  Procedure Laterality Date  . CESAREAN SECTION    . COLONOSCOPY  2005   in [place]  . TOTAL ABDOMINAL HYSTERECTOMY     Social History   Tobacco Use  . Smoking status: Never Smoker  . Smokeless tobacco: Never Used  Substance Use Topics  . Alcohol use: Yes    Alcohol/week: 0.0 oz    Comment: wine weekly  . Drug use: No   Family History  Problem Relation Age of Onset  . Coronary artery disease Mother   . Heart failure Mother   . Hypertension Father   . Coronary artery disease Father   . Cancer Father        smoker- lung cancer   . Hypertension Sister   . ADD / ADHD Daughter   . Alcohol abuse Son        in rehabilitation  . ADD / ADHD Son   . Colon cancer Neg Hx    No Known Allergies No current outpatient medications on file prior to visit.   No current facility-administered medications on file prior to visit.      Review of Systems  Constitutional: Positive for fatigue. Negative for activity change, appetite change, fever and unexpected weight change.  HENT: Negative for congestion, ear pain, rhinorrhea, sinus pressure and sore throat.   Eyes: Negative for pain, redness and visual disturbance.  Respiratory: Negative for cough, shortness of breath and wheezing.   Cardiovascular: Negative for chest pain and palpitations.  Gastrointestinal: Negative for abdominal pain, blood in stool, constipation and diarrhea.  Endocrine: Negative for polydipsia  and polyuria.    Genitourinary: Positive for frequency. Negative for dysuria and urgency.  Musculoskeletal: Negative for arthralgias, back pain and myalgias.  Skin: Negative for pallor and rash.  Allergic/Immunologic: Negative for environmental allergies.  Neurological: Negative for dizziness, syncope and headaches.  Hematological: Negative for adenopathy. Does not bruise/bleed easily.  Psychiatric/Behavioral: Negative for decreased concentration and dysphoric mood. The patient is not nervous/anxious.        Objective:   Physical Exam  Constitutional: She appears well-developed and well-nourished. No distress.  obese and well appearing   HENT:  Head: Normocephalic and atraumatic.  Right Ear: External ear normal.  Left Ear: External ear normal.  Mouth/Throat: Oropharynx is clear and moist.  Eyes: Pupils are equal, round, and reactive to light. Conjunctivae and EOM are normal. No scleral icterus.  Neck: Normal range of motion. Neck supple. No JVD present. Carotid bruit is not present. No thyromegaly present.  Cardiovascular: Normal rate, regular rhythm, normal heart sounds and intact distal pulses. Exam reveals no gallop.  Pulmonary/Chest: Effort normal and breath sounds normal. No respiratory distress. She has no wheezes. She exhibits no tenderness. No breast tenderness, discharge or bleeding.  Abdominal: Soft. Bowel sounds are normal. She exhibits no distension, no abdominal bruit and no mass. There is no tenderness.  No suprapubic tenderness or fullness     Genitourinary: No breast tenderness, discharge or bleeding.  Genitourinary Comments: Breast exam: No mass, nodules, thickening, tenderness, bulging, retraction, inflamation, nipple discharge or skin changes noted.  No axillary or clavicular LA.      Musculoskeletal: Normal range of motion. She exhibits no edema or tenderness.  Lymphadenopathy:    She has no cervical adenopathy.  Neurological: She is alert. She has normal reflexes. No cranial  nerve deficit. She exhibits normal muscle tone. Coordination normal.  Skin: Skin is warm and dry. No rash noted. No erythema. No pallor.  Solar lentigines diffusely Also few sks  Psychiatric: She has a normal mood and affect.  Good mood          Assessment & Plan:   Problem List Items Addressed This Visit      Cardiovascular and Mediastinum   Essential hypertension    bp in fair control at this time  BP Readings from Last 1 Encounters:  06/20/17 124/78   No changes needed Disc lifstyle change with low sodium diet and exercise  Labs reviewed         Genitourinary   Microscopic hematuria    UA today  Ongoing   Some symptoms of overactive bladder  May want to try medication in the future        Relevant Orders   POCT Urinalysis Dipstick (Automated) (Completed)     Other   Routine general medical examination at a health care facility - Primary    Reviewed health habits including diet and exercise and skin cancer prevention Reviewed appropriate screening tests for age  Also reviewed health mt list, fam hx and immunization status , as well as social and family history   See HPI Labs reviewed  Some c/o of overactive bladder symptoms  Disc diet/exercise for wt loss  Getting over caregiver stress and grief  Disc ca and D for bone health

## 2017-06-21 MED ORDER — SOLIFENACIN SUCCINATE 5 MG PO TABS: 5.0000 mg | ORAL_TABLET | Freq: Every day | ORAL | 11 refills | Status: DC

## 2017-06-21 NOTE — Telephone Encounter (Signed)
Trial of vesicare 

## 2017-06-22 ENCOUNTER — Telehealth: Payer: Self-pay | Admitting: Family Medicine

## 2017-06-22 DIAGNOSIS — H9319 Tinnitus, unspecified ear: Secondary | ICD-10-CM | POA: Insufficient documentation

## 2017-06-22 DIAGNOSIS — H9313 Tinnitus, bilateral: Secondary | ICD-10-CM

## 2017-06-22 NOTE — Telephone Encounter (Signed)
Ref done Will send to [facility] 

## 2017-09-20 ENCOUNTER — Ambulatory Visit: Payer: Self-pay

## 2017-09-20 NOTE — Telephone Encounter (Signed)
Outgoing call to patient who complains of poison rash . Was seen in the ER, in [place], [place] on Saturday. Patient was put on Prednisone . Is currently on day three.   Patient  wants to know if there is anything she can put on it to make sure it doesn't get infected. It is located on her right calf . States the rash  is approximately 4x5 inches 1/4 1/2 dollar size blisters below it.  Some blisters have burst. Denies fever. States she has put Vaseline on it and covered it with a gauze.   Recommended not to use Vaseline.  Recommended to keep site clean and  Dry open to air. Call if symptoms worsen and if fever develops..         Answer Assessment - Initial Assessment Questions 1. APPEARANCE of RASH: "Describe the rash."      Poison ivy was diagnosed at ER in [place] , [place] 2. LOCATION: "Where is the rash located?"  Calf area right leg    See   Notes 3. SIZE: "How large is the rash?"  States rash is very large.  Maybe 4x5 inches long.  With 1/4 to 1/2 dollar size blisters.  Below the rash area and below the area.        4. ONSET: "When did the rash begin?"       Over a week ago 5. ITCHING: "Does the rash itch?" If so, ask: "How bad is it?"   - MILD - doesn't interfere with normal activities no  - MODERATE - SEVERE: interferes with work, school, sleep, or other activities      No itchiching 6. PREGNANCY: "Is there any chance you are pregnant?" "When was your last menstrual period?"     no  Protocols used: POISON IVY - [facility] - SUMAC-A-AH

## 2018-01-17 ENCOUNTER — Ambulatory Visit (INDEPENDENT_AMBULATORY_CARE_PROVIDER_SITE_OTHER): Payer: Medicare Other | Admitting: Family Medicine

## 2018-01-17 ENCOUNTER — Encounter: Payer: Self-pay | Admitting: Family Medicine

## 2018-01-17 VITALS — BP 122/82 | HR 60 | Temp 98.4°F | Ht 62.0 in | Wt 172.5 lb

## 2018-01-17 DIAGNOSIS — Z23 Encounter for immunization: Secondary | ICD-10-CM

## 2018-01-17 DIAGNOSIS — J019 Acute sinusitis, unspecified: Secondary | ICD-10-CM | POA: Insufficient documentation

## 2018-01-17 DIAGNOSIS — J0111 Acute recurrent frontal sinusitis: Secondary | ICD-10-CM | POA: Diagnosis not present

## 2018-01-17 MED ORDER — AMOXICILLIN-POT CLAVULANATE 875-125 MG PO TABS: 1.0000 | ORAL_TABLET | Freq: Two times a day (BID) | ORAL | 0 refills | Status: DC

## 2018-01-17 MED ORDER — BENZONATATE 200 MG PO CAPS: 200.0000 mg | ORAL_CAPSULE | Freq: Three times a day (TID) | ORAL | 1 refills | Status: DC | PRN

## 2018-01-17 MED ORDER — PROMETHAZINE-DM 6.25-15 MG/5ML PO SYRP: 5.0000 mL | ORAL_SOLUTION | Freq: Every evening | ORAL | 0 refills | Status: DC | PRN

## 2018-01-17 NOTE — Progress Notes (Signed)
Subjective:    Patient ID: Jasmine Oliver, female    DOB: Jun 09, 1950, 67 y.o.   MRN: [id number]  HPI Here with c/o of cough for a few weeks  Traveled [place]-returned 10[place]20 Then started cold symptoms  The cough remained  Then worsened with laryngitis last week   Not a lot of congestion  A bit of nasal congestion   No fever  No chills/aches Throat is a little sore from cough  Ears feel ok  Some sinus pain over her eyes  Nasal d/c is light green/ not much   Cough is not productive  Dry  Chest is a little sore from cough  No wheeze  No tight breathing   Getting ready to travel to [place] on Sunday   Otc: took mucinex last night (DM) max Is helped a bit   Patient Active Problem List   Diagnosis Date Noted  . Acute sinusitis 01/17/2018  . Tinnitus 06/22/2017  . Memory changes 06/23/2016  . Colon cancer screening 05/20/2015  . Need for hepatitis C screening test 05/20/2015  . Screening for HIV (human immunodeficiency virus) 05/20/2015  . Skin cancer screening 05/20/2015  . Microscopic hematuria 02/05/2015  . GERD (gastroesophageal reflux disease) 10/12/2013  . Insomnia 10/12/2013  . Snoring 08/29/2013  . Obstructive sleep apnea 08/29/2013  . Dyspareunia 08/29/2013  . History of colon polyps 04/13/2011  . Routine general medical examination at a health care facility 12/17/2010  . Essential hypertension 10/27/2009  . STRESS REACTION, ACUTE, WITH EMOTIONAL DISTURBANCE 06/06/2007   Past Medical History:  Diagnosis Date  . Colon polyps   . Elevated BP    without hypertension  . Nevus    on left retina-- is watched by opthy   Past Surgical History:  Procedure Laterality Date  . CESAREAN SECTION    . COLONOSCOPY  2005   in [place]  . TOTAL ABDOMINAL HYSTERECTOMY     Social History   Tobacco Use  . Smoking status: Never Smoker  . Smokeless tobacco: Never Used  Substance Use Topics  . Alcohol use: Yes    Alcohol/week: 0.0 standard drinks    Comment: wine  weekly  . Drug use: No   Family History  Problem Relation Age of Onset  . Coronary artery disease Mother   . Heart failure Mother   . Hypertension Father   . Coronary artery disease Father   . Cancer Father        smoker- lung cancer   . Hypertension Sister   . ADD / ADHD Daughter   . Alcohol abuse Son        in rehabilitation  . ADD / ADHD Son   . Colon cancer Neg Hx    No Known Allergies No current outpatient medications on file prior to visit.   No current facility-administered medications on file prior to visit.     Review of Systems  Constitutional: Positive for appetite change. Negative for fatigue and fever.  HENT: Positive for congestion, ear pain, postnasal drip, rhinorrhea, sinus pressure and sore throat. Negative for nosebleeds.   Eyes: Negative for pain, redness and itching.  Respiratory: Positive for cough. Negative for shortness of breath and wheezing.   Cardiovascular: Negative for chest pain.  Gastrointestinal: Negative for abdominal pain, diarrhea, nausea and vomiting.  Endocrine: Negative for polyuria.  Genitourinary: Negative for dysuria, frequency and urgency.  Musculoskeletal: Negative for arthralgias and myalgias.  Allergic/Immunologic: Negative for immunocompromised state.  Neurological: Positive for headaches. Negative for dizziness, tremors,  syncope, weakness and numbness.  Hematological: Negative for adenopathy. Does not bruise/bleed easily.  Psychiatric/Behavioral: Negative for dysphoric mood. The patient is not nervous/anxious.        Objective:   Physical Exam  Constitutional: She appears well-developed and well-nourished. No distress.  obese and well appearing   HENT:  Head: Normocephalic and atraumatic.  Right Ear: External ear normal.  Left Ear: External ear normal.  Mouth/Throat: Oropharynx is clear and moist. No oropharyngeal exudate.  Nares are injected and congested  Bilateral frontal sinus tenderness  Post nasal drip  TMs are  dull but clear  Eyes: Pupils are equal, round, and reactive to light. Conjunctivae and EOM are normal. Right eye exhibits no discharge. Left eye exhibits no discharge.  Neck: Normal range of motion. Neck supple.  Cardiovascular: Normal rate and regular rhythm.  Pulmonary/Chest: Effort normal and breath sounds normal. No stridor. No respiratory distress. She has no wheezes. She has no rales.  Good air exch No rales or rhonchi No wheeze  Lymphadenopathy:    She has no cervical adenopathy.  Neurological: She is alert. No cranial nerve deficit.  Skin: Skin is warm and dry. No rash noted.  Psychiatric: She has a normal mood and affect.  Pleasant           Assessment & Plan:   Problem List Items Addressed This Visit      Respiratory   Acute sinusitis - Primary    S/p over a mo of uri symptoms- now sinus pain and cough  Cover with augmentin  Tessalon for cough  expectorantprn prometh DM for pm cough with caution of sedation  Disc symptomatic care - see instructions on AVS  Enc use of flonase Update if not starting to improve in a week or if worsening        Relevant Medications   amoxicillin-clavulanate (AUGMENTIN) 875-125 MG tablet   benzonatate (TESSALON) 200 MG capsule   promethazine-dextromethorphan (PROMETHAZINE-DM) 6.25-15 MG/5ML syrup    Other Visit Diagnoses    Need for influenza vaccination       Relevant Orders   Flu Vaccine QUAD 6+ mos PF IM (Fluarix Quad PF) (Completed)

## 2018-01-17 NOTE — Assessment & Plan Note (Signed)
S/p over a mo of uri symptoms- now sinus pain and cough  Cover with augmentin  Tessalon for cough  expectorantprn prometh DM for pm cough with caution of sedation  Disc symptomatic care - see instructions on AVS  Enc use of flonase Update if not starting to improve in a week or if worsening

## 2018-01-17 NOTE — Patient Instructions (Signed)
Drink lots of fluids and rest   Take augmentin for sinus infection  Nasal saline spray helps congestion   For cough Tessalon three times daily   mucinex DM - day  prometh-DM - night (sedating)   Update if not starting to improve in a week or if worsening    Flu shot today

## 2018-01-19 ENCOUNTER — Ambulatory Visit: Payer: 59

## 2018-04-26 ENCOUNTER — Encounter: Payer: Self-pay | Admitting: Family Medicine

## 2018-06-19 ENCOUNTER — Other Ambulatory Visit: Payer: Self-pay | Admitting: Family Medicine

## 2018-06-19 DIAGNOSIS — Z1231 Encounter for screening mammogram for malignant neoplasm of breast: Secondary | ICD-10-CM

## 2018-08-03 ENCOUNTER — Telehealth: Payer: Self-pay | Admitting: Family Medicine

## 2018-08-03 NOTE — Telephone Encounter (Signed)
Left message asking pt to call office regarding my chart message   Appointment Scheduled  Mychart, Generic  Jasmine, Oliver "Jasmine Oliver" Yesterday (8:44 AM)     Appointment Information:     Visit Type: Physical         Date: 08/17/2018                 Dept: [facility]                 Provider: Loura Pardon                 Time: 9:30 AM                 Length: 30 min   Appt Status: [place] Reason: Canceled via Daralene Milch"  Patient Appointment Cancel Request Pool Yesterday (8:44 AM)     Appointment canceled for Jasmine Oliver ([id number]) Visit Type: PHYSICAL Date        Time      Length    Provider                  Department 08/17/2018     9:30 AM  30 mins.  Tower, Wynelle Fanny, MD        [facility]  Reason for Cancellation: Canceled via MyChart  Patient Comments: I will need to reschedule for later date

## 2018-08-07 ENCOUNTER — Encounter: Payer: Self-pay | Admitting: Family Medicine

## 2018-08-11 ENCOUNTER — Ambulatory Visit
Admission: RE | Admit: 2018-08-11 | Discharge: 2018-08-11 | Disposition: A | Discharge status: Home / Self Care | Payer: Medicare Other | Source: Ambulatory Visit | Attending: Family Medicine | Admitting: Family Medicine

## 2018-08-11 DIAGNOSIS — Z1231 Encounter for screening mammogram for malignant neoplasm of breast: Secondary | ICD-10-CM

## 2018-08-17 ENCOUNTER — Encounter: Payer: 59 | Admitting: Family Medicine

## 2018-10-04 DIAGNOSIS — Z08 Encounter for follow-up examination after completed treatment for malignant neoplasm: Secondary | ICD-10-CM | POA: Diagnosis not present

## 2018-10-04 DIAGNOSIS — D485 Neoplasm of uncertain behavior of skin: Secondary | ICD-10-CM | POA: Diagnosis not present

## 2018-10-04 DIAGNOSIS — Z872 Personal history of diseases of the skin and subcutaneous tissue: Secondary | ICD-10-CM | POA: Diagnosis not present

## 2018-10-04 DIAGNOSIS — Z85828 Personal history of other malignant neoplasm of skin: Secondary | ICD-10-CM | POA: Diagnosis not present

## 2018-10-04 DIAGNOSIS — C44319 Basal cell carcinoma of skin of other parts of face: Secondary | ICD-10-CM | POA: Diagnosis not present

## 2018-10-04 DIAGNOSIS — Z09 Encounter for follow-up examination after completed treatment for conditions other than malignant neoplasm: Secondary | ICD-10-CM | POA: Diagnosis not present

## 2018-10-04 DIAGNOSIS — Z85820 Personal history of malignant melanoma of skin: Secondary | ICD-10-CM | POA: Diagnosis not present

## 2018-10-04 DIAGNOSIS — L237 Allergic contact dermatitis due to plants, except food: Secondary | ICD-10-CM | POA: Diagnosis not present

## 2018-10-04 DIAGNOSIS — C44619 Basal cell carcinoma of skin of left upper limb, including shoulder: Secondary | ICD-10-CM | POA: Diagnosis not present

## 2018-11-09 ENCOUNTER — Encounter: Payer: Self-pay | Admitting: Family Medicine

## 2018-11-15 DIAGNOSIS — C4441 Basal cell carcinoma of skin of scalp and neck: Secondary | ICD-10-CM | POA: Diagnosis not present

## 2018-11-15 DIAGNOSIS — C44319 Basal cell carcinoma of skin of other parts of face: Secondary | ICD-10-CM | POA: Diagnosis not present

## 2018-11-17 ENCOUNTER — Other Ambulatory Visit: Payer: Self-pay

## 2018-11-17 ENCOUNTER — Ambulatory Visit (INDEPENDENT_AMBULATORY_CARE_PROVIDER_SITE_OTHER): Payer: Medicare Other

## 2018-11-17 DIAGNOSIS — Z23 Encounter for immunization: Secondary | ICD-10-CM

## 2018-11-22 DIAGNOSIS — C44619 Basal cell carcinoma of skin of left upper limb, including shoulder: Secondary | ICD-10-CM | POA: Diagnosis not present

## 2018-12-21 ENCOUNTER — Encounter: Payer: Self-pay | Admitting: Family Medicine

## 2019-01-08 ENCOUNTER — Telehealth: Payer: Self-pay | Admitting: Family Medicine

## 2019-01-08 DIAGNOSIS — I10 Essential (primary) hypertension: Secondary | ICD-10-CM

## 2019-01-08 NOTE — Telephone Encounter (Signed)
-----   Message from Ellamae Sia sent at 01/01/2019  2:47 PM EST ----- Regarding: Lab orders for Wednesday, 11.25.20 Patient is scheduled for CPX labs, please order future labs, Thanks , Karna Christmas

## 2019-01-10 ENCOUNTER — Other Ambulatory Visit (INDEPENDENT_AMBULATORY_CARE_PROVIDER_SITE_OTHER): Payer: Medicare Other

## 2019-01-10 ENCOUNTER — Other Ambulatory Visit: Payer: Self-pay

## 2019-01-10 DIAGNOSIS — I10 Essential (primary) hypertension: Secondary | ICD-10-CM

## 2019-01-10 LAB — COMPREHENSIVE METABOLIC PANEL
ALT: 12 U/L (ref 0–35)
AST: 19 U/L (ref 0–37)
Albumin: 3.9 g/dL (ref 3.5–5.2)
Alkaline Phosphatase: 64 U/L (ref 39–117)
BUN: 13 mg/dL (ref 6–23)
CO2: 27 mEq/L (ref 19–32)
Calcium: 10.1 mg/dL (ref 8.4–10.5)
Chloride: 105 mEq/L (ref 96–112)
Creatinine, Ser: 0.81 mg/dL (ref 0.40–1.20)
GFR: 70.76 mL/min (ref >60.00)
Glucose, Bld: 83 mg/dL (ref 70–99)
Potassium: 5.5 mEq/L — ABNORMAL HIGH (ref 3.5–5.1)
Sodium: 141 mEq/L (ref 135–145)
Total Bilirubin: 0.6 mg/dL (ref 0.2–1.2)
Total Protein: 7 g/dL (ref 6.0–8.3)

## 2019-01-10 LAB — CBC WITH DIFFERENTIAL/PLATELET
Basophils Absolute: 0.1 10*3/uL (ref 0.0–0.1)
Basophils Relative: 1.4 % (ref 0.0–3.0)
Eosinophils Absolute: 0.1 10*3/uL (ref 0.0–0.7)
Eosinophils Relative: 3.2 % (ref 0.0–5.0)
HCT: 46.9 % — ABNORMAL HIGH (ref 36.0–46.0)
Hemoglobin: 15.5 g/dL — ABNORMAL HIGH (ref 12.0–15.0)
Lymphocytes Relative: 33.5 % (ref 12.0–46.0)
Lymphs Abs: 1.5 10*3/uL (ref 0.7–4.0)
MCHC: 32.9 g/dL (ref 30.0–36.0)
MCV: 90.1 fl (ref 78.0–100.0)
Monocytes Absolute: 0.4 10*3/uL (ref 0.1–1.0)
Monocytes Relative: 8.7 % (ref 3.0–12.0)
Neutro Abs: 2.4 10*3/uL (ref 1.4–7.7)
Neutrophils Relative %: 53.2 % (ref 43.0–77.0)
Platelets: 233 10*3/uL (ref 150.0–400.0)
RBC: 5.21 Mil/uL — ABNORMAL HIGH (ref 3.87–5.11)
RDW: 13.8 % (ref 11.5–15.5)
WBC: 4.5 10*3/uL (ref 4.0–10.5)

## 2019-01-10 LAB — LIPID PANEL
Cholesterol: 168 mg/dL (ref 0–200)
HDL: 52.3 mg/dL (ref >39.00)
LDL Cholesterol: 107 mg/dL — ABNORMAL HIGH (ref 0–99)
NonHDL: 115.35
Total CHOL/HDL Ratio: 3
Triglycerides: 44 mg/dL (ref 0.0–149.0)
VLDL: 8.8 mg/dL (ref 0.0–40.0)

## 2019-01-10 LAB — TSH: TSH: 1.34 u[IU]/mL (ref 0.35–4.50)

## 2019-01-17 ENCOUNTER — Other Ambulatory Visit: Payer: Self-pay

## 2019-01-17 ENCOUNTER — Ambulatory Visit (INDEPENDENT_AMBULATORY_CARE_PROVIDER_SITE_OTHER): Payer: Medicare Other | Admitting: Family Medicine

## 2019-01-17 ENCOUNTER — Encounter: Payer: Self-pay | Admitting: Family Medicine

## 2019-01-17 VITALS — BP 126/82 | HR 68 | Temp 97.8°F | Ht 62.25 in | Wt 159.1 lb

## 2019-01-17 DIAGNOSIS — Z23 Encounter for immunization: Secondary | ICD-10-CM | POA: Diagnosis not present

## 2019-01-17 DIAGNOSIS — E875 Hyperkalemia: Secondary | ICD-10-CM

## 2019-01-17 DIAGNOSIS — I10 Essential (primary) hypertension: Secondary | ICD-10-CM | POA: Diagnosis not present

## 2019-01-17 DIAGNOSIS — R413 Other amnesia: Secondary | ICD-10-CM | POA: Diagnosis not present

## 2019-01-17 DIAGNOSIS — Z0000 Encounter for general adult medical examination without abnormal findings: Secondary | ICD-10-CM | POA: Insufficient documentation

## 2019-01-17 NOTE — Patient Instructions (Addendum)
Try to get 1200-1500 mg of calcium per day with at least 1000 iu of vitamin D - for bone health   prevnar vaccine today   If you want to update your adv directive-here is material to do it   For weight management Try to get most of your carbohydrates from produce (with the exception of white potatoes)  Eat less bread/pasta/rice/snack foods/cereals/sweets and other items from the middle of the grocery store (processed carbs)   For cholesterol Avoid red meat/ fried foods/ egg yolks/ fatty breakfast meats/ butter, cheese and high fat dairy/ and shellfish    Schedule labs to re check potassium  Drink lots of water before draw  Stop the keto diet

## 2019-01-17 NOTE — Assessment & Plan Note (Signed)
Still occasionally misses a word  Has not progressed  No cognitive concerns

## 2019-01-17 NOTE — Progress Notes (Signed)
Subjective:    Patient ID: Jasmine Oliver, female    DOB: 22-May-1950, 68 y.o.   MRN: [id number]  HPI  Pt presents for welcome to medicare prev exam  I have personally reviewed the Medicare Annual Wellness questionnaire and have noted 1. The patient's medical and social history 2. Their use of alcohol, tobacco or illicit drugs 3. Their current medications and supplements 4. The patient's functional ability including ADL's, fall risks, home safety risks and hearing or visual             impairment. 5. Diet and physical activities 6. Evidence for depression or mood disorders  The patients weight, height, BMI have been recorded in the chart and visual acuity is per eye clinic.  I have made referrals, counseling and provided education to the patient based review of the above and I have provided the pt with a written personalized care plan for preventive services. Reviewed and updated provider list, see scanned forms.  See scanned forms.  Routine anticipatory guidance given to patient.  See health maintenance. Colon cancer screening 6/17 colonoscopy with 5 y recall  Breast cancer screening mammogram 6/20 Hysterectomy in the past Self breast exam -no lumps  Flu vaccine 10/20 Tetanus vaccine 1/14 Tdap Pneumovax due for prevnar-will get today  Zoster vaccine-had one shingrix vaccine  Dexa 12/16-normal at [facility]yn ([facility])  Falls - none  Fractures-none  Supplements she is not taking ca and D  She is in the sun a lot  Exercise -plays a lot of golf and walks (does not ride cart)   Advance directive - out of date  Cognitive function addressed- see scanned forms- and if abnormal then additional documentation follows.  Has had concerns in the past - word finding  Had MMS score of 30 in the past Short term memory is harder  No trouble with concentration   PMH and SH reviewed  Meds, vitals, and allergies reviewed.   ROS: See HPI.  Otherwise negative.    Weight : Wt Readings from  Last 3 Encounters:  01/17/19 159 lb 2 oz (72.2 kg)  01/17/18 172 lb 8 oz (78.2 kg)  06/20/17 163 lb (73.9 kg)  she has worked on weight loss !    Exercise is regular  Eating less carbs /was doing keto  28.87 kg/m   Hearing/vision:  Hearing Screening   125Hz  250Hz  500Hz  1000Hz  2000Hz  3000Hz  4000Hz  6000Hz  8000Hz   Right ear:   40 40 40  40    Left ear:   40 40 40  40      Visual Acuity Screening   Right eye Left eye Both eyes  Without correction:     With correction: 20/25 20/30 20/25   has chronic tinnitus   Does wear contacts-mono vision   bp is stable today  No medications  No cp or palpitations or headaches or edema  No side effects to medicines  BP Readings from Last 3 Encounters:  01/17/19 126/82  01/17/18 122/82  06/20/17 124/78      Pulse Readings from Last 3 Encounters:  01/17/19 68  01/17/18 60  06/20/17 (!) 54   Cholesterol Lab Results  Component Value Date   CHOL 168 01/10/2019   CHOL 153 06/15/2017   CHOL 143 05/31/2016   Lab Results  Component Value Date   HDL 52.30 01/10/2019   HDL 53.50 06/15/2017   HDL 54.40 05/31/2016   Lab Results  Component Value Date   LDLCALC 107 (H) 01/10/2019  LDLCALC 91 06/15/2017   LDLCALC 81 05/31/2016   Lab Results  Component Value Date   TRIG 44.0 01/10/2019   TRIG 42.0 06/15/2017   TRIG 39.0 05/31/2016   Lab Results  Component Value Date   CHOLHDL 3 01/10/2019   CHOLHDL 3 06/15/2017   CHOLHDL 3 05/31/2016   No results found for: LDLDIRECT On keto diet-LDL went up   Other labs Results for orders placed or performed in visit on 01/10/19  TSH  Result Value Ref Range   TSH 1.34 0.35 - 4.50 uIU/mL  Lipid panel  Result Value Ref Range   Cholesterol 168 0 - 200 mg/dL   Triglycerides 44.0 0.0 - 149.0 mg/dL   HDL 52.30 >39.00 mg/dL   VLDL 8.8 0.0 - 40.0 mg/dL   LDL Cholesterol 107 (H) 0 - 99 mg/dL   Total CHOL/HDL Ratio 3    NonHDL 115.35   CBC with Differential  Result Value Ref Range   WBC  4.5 4.0 - 10.5 K/uL   RBC 5.21 (H) 3.87 - 5.11 Mil/uL   Hemoglobin 15.5 (H) 12.0 - 15.0 g/dL   HCT 46.9 (H) 36.0 - 46.0 %   MCV 90.1 78.0 - 100.0 fl   MCHC 32.9 30.0 - 36.0 g/dL   RDW 13.8 11.5 - 15.5 %   Platelets 233.0 150.0 - 400.0 K/uL   Neutrophils Relative % 53.2 43.0 - 77.0 %   Lymphocytes Relative 33.5 12.0 - 46.0 %   Monocytes Relative 8.7 3.0 - 12.0 %   Eosinophils Relative 3.2 0.0 - 5.0 %   Basophils Relative 1.4 0.0 - 3.0 %   Neutro Abs 2.4 1.4 - 7.7 K/uL   Lymphs Abs 1.5 0.7 - 4.0 K/uL   Monocytes Absolute 0.4 0.1 - 1.0 K/uL   Eosinophils Absolute 0.1 0.0 - 0.7 K/uL   Basophils Absolute 0.1 0.0 - 0.1 K/uL  Comprehensive metabolic panel  Result Value Ref Range   Sodium 141 135 - 145 mEq/L   Potassium 5.5 (H) 3.5 - 5.1 mEq/L   Chloride 105 96 - 112 mEq/L   CO2 27 19 - 32 mEq/L   Glucose, Bld 83 70 - 99 mg/dL   BUN 13 6 - 23 mg/dL   Creatinine, Ser 0.81 0.40 - 1.20 mg/dL   Total Bilirubin 0.6 0.2 - 1.2 mg/dL   Alkaline Phosphatase 64 39 - 117 U/L   AST 19 0 - 37 U/L   ALT 12 0 - 35 U/L   Total Protein 7.0 6.0 - 8.3 g/dL   Albumin 3.9 3.5 - 5.2 g/dL   GFR 70.76 >60.00 mL/min   Calcium 10.1 8.4 - 10.5 mg/dL     Patient Active Problem List   Diagnosis Date Noted  . Welcome to Medicare preventive visit 01/17/2019  . High potassium 01/17/2019  . Tinnitus 06/22/2017  . Memory changes 06/23/2016  . Colon cancer screening 05/20/2015  . Skin cancer screening 05/20/2015  . Microscopic hematuria 02/05/2015  . GERD (gastroesophageal reflux disease) 10/12/2013  . Insomnia 10/12/2013  . Snoring 08/29/2013  . Obstructive sleep apnea 08/29/2013  . Dyspareunia 08/29/2013  . History of colon polyps 04/13/2011  . Routine general medical examination at a health care facility 12/17/2010  . Essential hypertension 10/27/2009  . STRESS REACTION, ACUTE, WITH EMOTIONAL DISTURBANCE 06/06/2007   Past Medical History:  Diagnosis Date  . Colon polyps   . Elevated BP    without  hypertension  . Nevus    on left retina-- is watched  by opthy   Past Surgical History:  Procedure Laterality Date  . CESAREAN SECTION    . COLONOSCOPY  2005   in [place]  . TOTAL ABDOMINAL HYSTERECTOMY     Social History   Tobacco Use  . Smoking status: Never Smoker  . Smokeless tobacco: Never Used  Substance Use Topics  . Alcohol use: Yes    Alcohol/week: 0.0 standard drinks    Comment: wine weekly  . Drug use: No   Family History  Problem Relation Age of Onset  . Coronary artery disease Mother   . Heart failure Mother   . Hypertension Father   . Coronary artery disease Father   . Cancer Father        smoker- lung cancer   . Hypertension Sister   . ADD / ADHD Daughter   . Alcohol abuse Son        in rehabilitation  . ADD / ADHD Son   . Colon cancer Neg Hx    No Known Allergies No current outpatient medications on file prior to visit.   No current facility-administered medications on file prior to visit.     Review of Systems  Constitutional: Negative for activity change, appetite change, fatigue, fever and unexpected weight change.  HENT: Negative for congestion, ear pain, rhinorrhea, sinus pressure and sore throat.   Eyes: Negative for pain, redness and visual disturbance.  Respiratory: Negative for cough, shortness of breath and wheezing.   Cardiovascular: Negative for chest pain and palpitations.  Gastrointestinal: Negative for abdominal pain, blood in stool, constipation and diarrhea.  Endocrine: Negative for polydipsia and polyuria.  Genitourinary: Negative for dysuria, frequency and urgency.  Musculoskeletal: Negative for arthralgias, back pain and myalgias.  Skin: Negative for pallor and rash.  Allergic/Immunologic: Negative for environmental allergies.  Neurological: Negative for dizziness, syncope and headaches.  Hematological: Negative for adenopathy. Does not bruise/bleed easily.  Psychiatric/Behavioral: Negative for decreased concentration and  dysphoric mood. The patient is not nervous/anxious.        Objective:   Physical Exam Constitutional:      General: She is not in acute distress.    Appearance: Normal appearance. She is well-developed and normal weight. She is not ill-appearing or diaphoretic.  HENT:     Head: Normocephalic and atraumatic.     Right Ear: Tympanic membrane, ear canal and external ear normal.     Left Ear: Tympanic membrane, ear canal and external ear normal.     Nose: Nose normal. No congestion.     Mouth/Throat:     Mouth: Mucous membranes are moist.     Pharynx: Oropharynx is clear. No posterior oropharyngeal erythema.  Eyes:     General: No scleral icterus.    Extraocular Movements: Extraocular movements intact.     Conjunctiva/sclera: Conjunctivae normal.     Pupils: Pupils are equal, round, and reactive to light.  Neck:     Musculoskeletal: Normal range of motion and neck supple. No neck rigidity or muscular tenderness.     Thyroid: No thyromegaly.     Vascular: No carotid bruit or JVD.  Cardiovascular:     Rate and Rhythm: Normal rate and regular rhythm.     Pulses: Normal pulses.     Heart sounds: Normal heart sounds. No gallop.   Pulmonary:     Effort: Pulmonary effort is normal. No respiratory distress.     Breath sounds: Normal breath sounds. No wheezing.     Comments: Good air exch Chest:  Chest wall: No tenderness.  Abdominal:     General: Bowel sounds are normal. There is no distension or abdominal bruit.     Palpations: Abdomen is soft. There is no mass.     Tenderness: There is no abdominal tenderness.     Hernia: No hernia is present.  Genitourinary:    Comments: Breast exam: No mass, nodules, thickening, tenderness, bulging, retraction, inflamation, nipple discharge or skin changes noted.  No axillary or clavicular LA.     Musculoskeletal: Normal range of motion.        General: No tenderness.     Right lower leg: No edema.     Left lower leg: No edema.     Comments:  No kyphosis   Lymphadenopathy:     Cervical: No cervical adenopathy.  Skin:    General: Skin is warm and dry.     Coloration: Skin is not pale.     Findings: No erythema or rash.     Comments: Tanned mildly Solar lentigines diffusely   Neurological:     Mental Status: She is alert. Mental status is at baseline.     Cranial Nerves: No cranial nerve deficit.     Motor: No abnormal muscle tone.     Coordination: Coordination normal.     Gait: Gait normal.     Deep Tendon Reflexes: Reflexes are normal and symmetric. Reflexes normal.  Psychiatric:        Mood and Affect: Mood normal.        Cognition and Memory: Cognition and memory normal.           Assessment & Plan:   Problem List Items Addressed This Visit      Cardiovascular and Mediastinum   Essential hypertension    bp in fair control at this time without medication BP Readings from Last 1 Encounters:  01/17/19 126/82   No changes needed Most recent labs reviewed  Disc lifstyle change with low sodium diet and exercise          Other   Memory changes    Still occasionally misses a word  Has not progressed  No cognitive concerns       Welcome to Medicare preventive visit - Primary    Reviewed health habits including diet and exercise and skin cancer prevention Reviewed appropriate screening tests for age  Also reviewed health mt list, fam hx and immunization status , as well as social and family history   See HPI Labs reviewed  prevnar given today  Noted nl dexa 12/16  And no falls or fx Adv re: ca and D supplementation for bone health  Materials given to work on [employer] directive No cognitive changes or concerns Wt loss commended (good habits) -enc she change from keto diet to getting carbs from produce Hearing screen nl  Vision screen is fairly good with contact lenses Disc diet to reduce cholesterol         High potassium    Lab Results  Component Value Date   K 5.5 (H) 01/10/2019   No current  supplements or renal issues Suspect this may have been hemolyzed (per pt had a traumatic draw) She will return for re draw after drinking lots of water        Other Visit Diagnoses    Need for vaccination with 13-polyvalent pneumococcal conjugate vaccine       Relevant Orders   Pneumococcal conjugate vaccine 13-valent (Completed)

## 2019-01-17 NOTE — Assessment & Plan Note (Signed)
Lab Results  Component Value Date   K 5.5 (H) 01/10/2019   No current supplements or renal issues Suspect this may have been hemolyzed (per pt had a traumatic draw) She will return for re draw after drinking lots of water

## 2019-01-17 NOTE — Assessment & Plan Note (Signed)
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

## 2019-01-17 NOTE — Assessment & Plan Note (Addendum)
bp in fair control at this time without medication BP Readings from Last 1 Encounters:  01/17/19 126/82   No changes needed Most recent labs reviewed  Disc lifstyle change with low sodium diet and exercise

## 2019-01-30 ENCOUNTER — Telehealth: Payer: Self-pay | Admitting: Family Medicine

## 2019-01-30 DIAGNOSIS — E875 Hyperkalemia: Secondary | ICD-10-CM

## 2019-01-30 NOTE — Telephone Encounter (Signed)
-----   Message from Ellamae Sia sent at 01/23/2019  3:17 PM EST ----- Regarding: lab orders for Wednesday, 12.16.20 Lab orders for f/u

## 2019-01-31 ENCOUNTER — Other Ambulatory Visit (INDEPENDENT_AMBULATORY_CARE_PROVIDER_SITE_OTHER): Payer: Medicare Other

## 2019-01-31 ENCOUNTER — Other Ambulatory Visit: Payer: Self-pay

## 2019-01-31 DIAGNOSIS — E875 Hyperkalemia: Secondary | ICD-10-CM

## 2019-01-31 LAB — BASIC METABOLIC PANEL
BUN: 16 mg/dL (ref 6–23)
CO2: 27 mEq/L (ref 19–32)
Calcium: 9.4 mg/dL (ref 8.4–10.5)
Chloride: 108 mEq/L (ref 96–112)
Creatinine, Ser: 0.72 mg/dL (ref 0.40–1.20)
GFR: 81.05 mL/min (ref >60.00)
Glucose, Bld: 92 mg/dL (ref 70–99)
Potassium: 3.7 mEq/L (ref 3.5–5.1)
Sodium: 141 mEq/L (ref 135–145)

## 2019-02-13 ENCOUNTER — Encounter: Payer: Self-pay | Admitting: Family Medicine

## 2019-04-25 DIAGNOSIS — L821 Other seborrheic keratosis: Secondary | ICD-10-CM | POA: Diagnosis not present

## 2019-04-25 DIAGNOSIS — L814 Other melanin hyperpigmentation: Secondary | ICD-10-CM | POA: Diagnosis not present

## 2019-04-25 DIAGNOSIS — Z85820 Personal history of malignant melanoma of skin: Secondary | ICD-10-CM | POA: Diagnosis not present

## 2019-04-25 DIAGNOSIS — D2261 Melanocytic nevi of right upper limb, including shoulder: Secondary | ICD-10-CM | POA: Diagnosis not present

## 2019-04-25 DIAGNOSIS — D2262 Melanocytic nevi of left upper limb, including shoulder: Secondary | ICD-10-CM | POA: Diagnosis not present

## 2019-04-25 DIAGNOSIS — Z85828 Personal history of other malignant neoplasm of skin: Secondary | ICD-10-CM | POA: Diagnosis not present

## 2019-04-25 DIAGNOSIS — X32XXXA Exposure to sunlight, initial encounter: Secondary | ICD-10-CM | POA: Diagnosis not present

## 2019-04-25 DIAGNOSIS — D225 Melanocytic nevi of trunk: Secondary | ICD-10-CM | POA: Diagnosis not present

## 2019-04-30 ENCOUNTER — Ambulatory Visit: Payer: Medicare Other | Attending: Internal Medicine

## 2019-04-30 DIAGNOSIS — Z23 Encounter for immunization: Secondary | ICD-10-CM

## 2019-04-30 NOTE — Progress Notes (Signed)
   Covid-19 Vaccination Clinic  Name:  Jasmine Oliver    MRN: [id number] DOB: 11-23-1950  04/30/2019  Jasmine Oliver was observed post Covid-19 immunization for 15 minutes without incident. She was provided with Vaccine Information Sheet and instruction to access the V-Safe system.   Jasmine Oliver was instructed to call 911 with any severe reactions post vaccine: Marland Kitchen Difficulty breathing  . Swelling of face and throat  . A fast heartbeat  . A bad rash all over body  . Dizziness and weakness   Immunizations Administered    Name Date Dose VIS Date Route   Pfizer COVID-19 Vaccine 04/30/2019 11:12 AM 0.3 mL 01/26/2019 Intramuscular   Manufacturer: [place]   Lot: [id number]   [place]: [id number]

## 2019-07-16 ENCOUNTER — Encounter: Payer: Self-pay | Admitting: Family Medicine

## 2019-10-03 DIAGNOSIS — Z20828 Contact with and (suspected) exposure to other viral communicable diseases: Secondary | ICD-10-CM | POA: Diagnosis not present

## 2019-11-07 ENCOUNTER — Other Ambulatory Visit: Payer: Self-pay | Admitting: Family Medicine

## 2019-11-07 DIAGNOSIS — Z1231 Encounter for screening mammogram for malignant neoplasm of breast: Secondary | ICD-10-CM

## 2019-11-09 DIAGNOSIS — Z23 Encounter for immunization: Secondary | ICD-10-CM | POA: Diagnosis not present

## 2019-11-22 ENCOUNTER — Ambulatory Visit
Admission: RE | Admit: 2019-11-22 | Discharge: 2019-11-22 | Disposition: A | Discharge status: Home / Self Care | Payer: Medicare Other | Source: Ambulatory Visit

## 2019-11-22 ENCOUNTER — Other Ambulatory Visit: Payer: Self-pay

## 2019-11-22 DIAGNOSIS — Z1231 Encounter for screening mammogram for malignant neoplasm of breast: Secondary | ICD-10-CM

## 2020-02-04 ENCOUNTER — Other Ambulatory Visit: Payer: Medicare Other

## 2020-02-04 DIAGNOSIS — Z20822 Contact with and (suspected) exposure to covid-19: Secondary | ICD-10-CM

## 2020-02-05 ENCOUNTER — Telehealth: Payer: Self-pay | Admitting: Family Medicine

## 2020-02-05 DIAGNOSIS — I10 Essential (primary) hypertension: Secondary | ICD-10-CM

## 2020-02-05 LAB — SARS-COV-2, NAA 2 DAY TAT

## 2020-02-05 LAB — NOVEL CORONAVIRUS, NAA: SARS-CoV-2, NAA: NOT DETECTED

## 2020-02-05 NOTE — Telephone Encounter (Signed)
-----   Message from Cloyd Stagers, RT sent at 01/22/2020  1:49 PM EST ----- Regarding: Lab Orders for Wednesday 12.22.2021 Please place lab orders for Wednesday 12.22.2021, office visit for physical on Tuesday 12.28.2021 Thank you, Dyke Maes RT(R)

## 2020-02-06 ENCOUNTER — Other Ambulatory Visit: Payer: Self-pay

## 2020-02-06 ENCOUNTER — Telehealth: Payer: Self-pay | Admitting: Family Medicine

## 2020-02-06 ENCOUNTER — Ambulatory Visit (INDEPENDENT_AMBULATORY_CARE_PROVIDER_SITE_OTHER): Payer: Medicare Other

## 2020-02-06 ENCOUNTER — Other Ambulatory Visit: Payer: Medicare Other

## 2020-02-06 DIAGNOSIS — Z0000 Encounter for general adult medical examination without abnormal findings: Secondary | ICD-10-CM | POA: Diagnosis not present

## 2020-02-06 DIAGNOSIS — I10 Essential (primary) hypertension: Secondary | ICD-10-CM

## 2020-02-06 NOTE — Progress Notes (Signed)
Subjective:   Jasmine Oliver is a 69 y.o. female who presents for Medicare Annual (Subsequent) preventive examination.  Review of Systems: N/A      I connected with the patient today by telephone and verified that I am speaking with the correct person using two identifiers. Location patient: home Location nurse: work Persons participating in the telephone visit: patient, nurse.   I discussed the limitations, risks, security and privacy concerns of performing an evaluation and management service by telephone and the availability of in person appointments. I also discussed with the patient that there may be a patient responsible charge related to this service. The patient expressed understanding and verbally consented to this telephonic visit.        Cardiac Risk Factors include: advanced age (>62men, >65 women);hypertension     Objective:    Today's Vitals   There is no height or weight on file to calculate BMI.  Advanced Directives 02/06/2020 08/04/2015 07/21/2015 02/18/2014  Does Patient Have a Medical Advance Directive? No No No No  Would patient like information on creating a medical advance directive? Yes (MAU/Ambulatory/Procedural Areas - Information given) No - patient declined information - No - patient declined information    Current Medications (verified) No outpatient encounter medications on file as of 02/06/2020.   No facility-administered encounter medications on file as of 02/06/2020.    Allergies (verified) Patient has no known allergies.   History: Past Medical History:  Diagnosis Date  . Colon polyps   . Elevated BP    without hypertension  . Nevus    on left retina-- is watched by opthy   Past Surgical History:  Procedure Laterality Date  . CESAREAN SECTION    . COLONOSCOPY  2005   in [place]  . TOTAL ABDOMINAL HYSTERECTOMY     Family History  Problem Relation Age of Onset  . Coronary artery disease Mother   . Heart failure Mother   . Hypertension  Father   . Coronary artery disease Father   . Cancer Father        smoker- lung cancer   . Hypertension Sister   . ADD / ADHD Daughter   . Alcohol abuse Son        in rehabilitation  . ADD / ADHD Son   . Colon cancer Neg Hx    Social History   Socioeconomic History  . Marital status: Married    Spouse name: Not on file  . Number of children: 4  . Years of education: Not on file  . Highest education level: Not on file  Occupational History  . Occupation: Sales promotion account executive  Tobacco Use  . Smoking status: Never Smoker  . Smokeless tobacco: Never Used  Substance and Sexual Activity  . Alcohol use: Yes    Alcohol/week: 0.0 standard drinks    Comment: wine weekly  . Drug use: No  . Sexual activity: Not on file  Other Topics Concern  . Not on file  Social History Narrative   Plays Golf and goes to the gym   Social Determinants of Health   Financial Resource Strain: Low Risk   . Difficulty of Paying Living Expenses: Not hard at all  Food Insecurity: No Food Insecurity  . Worried About Charity fundraiser in the Last Year: Never true  . Ran Out of Food in the Last Year: Never true  Transportation Needs: No Transportation Needs  . Lack of Transportation (Medical): No  . Lack of Transportation (Non-Medical): No  Physical Activity: Insufficiently Active  . Days of Exercise per Week: 3 days  . Minutes of Exercise per Session: 30 min  Stress: No Stress Concern Present  . Feeling of Stress : Not at all  Social Connections: Not on file    Tobacco Counseling Counseling given: Not Answered   Clinical Intake:  Pre-visit preparation completed: Yes  Pain : No/denies pain     Nutritional Risks: None Diabetes: No  How often do you need to have someone help you when you read instructions, pamphlets, or other written materials from your doctor or pharmacy?: 1 - Never What is the last grade level you completed in school?: bachelors  Diabetic: No Nutrition Risk  Assessment:  Has the patient had any N/V/D within the last 2 months?  No  Does the patient have any non-healing wounds?  No  Has the patient had any unintentional weight loss or weight gain?  No   Diabetes:  Is the patient diabetic?  No  If diabetic, was a CBG obtained today?  N/A Did the patient bring in their glucometer from home?  N/A How often do you monitor your CBG's? N/A.   Financial Strains and Diabetes Management:  Are you having any financial strains with the device, your supplies or your medication? N/A.  Does the patient want to be seen by Chronic Care Management for management of their diabetes?  N/A Would the patient like to be referred to a Nutritionist or for Diabetic Management?  N/A  Interpreter Needed?: No  Information entered by :: CJohnson, LPN   Activities of Daily Living In your present state of health, do you have any difficulty performing the following activities: 02/06/2020  Hearing? N  Vision? N  Difficulty concentrating or making decisions? N  Walking or climbing stairs? N  Dressing or bathing? N  Doing errands, shopping? N  Preparing Food and eating ? N  Using the Toilet? N  In the past six months, have you accidently leaked urine? N  Do you have problems with loss of bowel control? N  Managing your Medications? N  Managing your Finances? N  Housekeeping or managing your Housekeeping? N  Some recent data might be hidden    Patient Care Team: Tower, Wynelle Fanny, MD as PCP - General  Indicate any recent Medical Services you may have received from other than [facility] providers in the past year (date may be approximate).     Assessment:   This is a routine wellness examination for Saraih.  Hearing/Vision screen  Hearing Screening   125Hz  250Hz  500Hz  1000Hz  2000Hz  3000Hz  4000Hz  6000Hz  8000Hz   Right ear:           Left ear:           Vision Screening Comments: Patient gets annual eye exams  Dietary issues and exercise activities  discussed: Current Exercise Habits: Home exercise routine, Type of exercise: Other - see comments (aerobics), Time (Minutes): 30, Frequency (Times/Week): 3, Weekly Exercise (Minutes/Week): 90, Intensity: Moderate, Exercise limited by: None identified  Goals    . Patient Stated     02/06/2020, I will continue to do aerobics 3 days a week for about 30 minutes.       Depression Screen PHQ 2/9 Scores 02/06/2020 01/17/2019 06/20/2017  PHQ - 2 Score 0 0 1  PHQ- 9 Score 0 - -    Fall Risk Fall Risk  02/06/2020 01/17/2019  Falls in the past year? 0 0  Number falls in past yr: 0 -  Injury with Fall? 0 -  Risk for fall due to : No Fall Risks -  Follow up Falls evaluation completed;Falls prevention discussed Falls evaluation completed    FALL RISK PREVENTION PERTAINING TO THE HOME:  Any stairs in or around the home? Yes  If so, are there any without handrails? No  Home free of loose throw rugs in walkways, pet beds, electrical cords, etc? Yes  Adequate lighting in your home to reduce risk of falls? Yes   ASSISTIVE DEVICES UTILIZED TO PREVENT FALLS:  Life alert? No  Use of a cane, walker or w/c? No  Grab bars in the bathroom? No  Shower chair or bench in shower? No  Elevated toilet seat or a handicapped toilet? No   TIMED UP AND GO:  Was the test performed? N/A, telephone visit .    Cognitive Function: MMSE - Mini Mental State Exam 02/06/2020  Orientation to time 5  Orientation to Place 5  Registration 3  Attention/ Calculation 5  Recall 3  Language- repeat 1        Mini Cog  Mini-Cog screen was completed. Maximum score is 22. A value of 0 denotes this part of the MMSE was not completed or the patient failed this part of the Mini-Cog screening.   Immunizations Immunization History  Administered Date(s) Administered  . Fluad Quad(high Dose 65+) 11/17/2018  . Hepatitis A 07/17/2002  . Influenza Split 03/10/2012  . Influenza Whole 11/16/2006, 10/16/2009  .  Influenza,inj,Quad PF,6+ Mos 01/20/2017, 01/17/2018  . Influenza-Unspecified 01/22/2015, 12/07/2019  . PFIZER SARS-COV-2 Vaccination 04/07/2019, 04/30/2019, 12/07/2019  . Pneumococcal Conjugate-13 01/17/2019  . Td 07/17/2002  . Tdap 03/10/2012  . Zoster 05/20/2014  . Zoster Recombinat (Shingrix) 12/07/2018, 02/19/2019    TDAP status: Up to date  Flu Vaccine status: Up to date  Pneumococcal vaccine status: Due, Education has been provided regarding the importance of this vaccine. Advised may receive this vaccine at local pharmacy or Health Dept. Aware to provide a copy of the vaccination record if obtained from local pharmacy or Health Dept. Verbalized acceptance and understanding.  Covid-19 vaccine status: Completed vaccines  Qualifies for Shingles Vaccine? Yes   Zostavax completed Yes   Shingrix Completed?: Yes  Screening Tests Health Maintenance  Topic Date Due  . MAMMOGRAM  08/11/2019  . PNA vac Low Risk Adult (2 of 2 - PPSV23) 01/17/2020  . COLONOSCOPY  08/03/2020  . TETANUS/TDAP  03/10/2022  . INFLUENZA VACCINE  Completed  . DEXA SCAN  Completed  . COVID-19 Vaccine  Completed  . Hepatitis C Screening  Completed    Health Maintenance  Health Maintenance Due  Topic Date Due  . MAMMOGRAM  08/11/2019  . PNA vac Low Risk Adult (2 of 2 - PPSV23) 01/17/2020    Colorectal cancer screening: Type of screening: Colonoscopy. Completed 08/04/2015. Repeat every 5 years  Mammogram status: scheduled 02/07/2020 per patient   Bone Density status: due, will discuss with provider at physical   Lung Cancer Screening: (Low Dose CT Chest recommended if Age 68-80 years, 30 pack-year currently smoking OR have quit w/in 15years.) does not qualify.    Additional Screening:  Hepatitis C Screening: does qualify; Completed 05/20/2015  Vision Screening: Recommended annual ophthalmology exams for early detection of glaucoma and other disorders of the eye. Is the patient up to date with  their annual eye exam?  Yes  Who is the provider or what is the name of the office in which the patient attends annual eye exams?  [facility] If pt is not established with a provider, would they like to be referred to a provider to establish care? No .   Dental Screening: Recommended annual dental exams for proper oral hygiene  Community Resource Referral / Chronic Care Management: CRR required this visit?  No   CCM required this visit?  No      Plan:     I have personally reviewed and noted the following in the patient's chart:   . Medical and social history . Use of alcohol, tobacco or illicit drugs  . Current medications and supplements . Functional ability and status . Nutritional status . Physical activity . Advanced directives . List of other physicians . Hospitalizations, surgeries, and ER visits in previous 12 months . Vitals . Screenings to include cognitive, depression, and falls . Referrals and appointments  In addition, I have reviewed and discussed with patient certain preventive protocols, quality metrics, and best practice recommendations. A written personalized care plan for preventive services as well as general preventive health recommendations were provided to patient.   Due to this being a telephonic visit, the after visit summary with patients personalized plan was offered to patient via office or my-chart. Patient preferred to pick up at office at next visit or my chart.   Andrez Grime, LPN   [phone]

## 2020-02-06 NOTE — Telephone Encounter (Signed)
-----   Message from Cloyd Stagers, RT sent at 02/05/2020  2:21 PM EST ----- Regarding: Lab Orders for Thursday 12.23.2021 Please place lab orders for Thursday 12.23.2021, office visit for physical on Tuesday 12.28.2021 Thank you, Dyke Maes RT(R)

## 2020-02-06 NOTE — Progress Notes (Signed)
PCP notes:  Health Maintenance: P23- due Dexa- due   Abnormal Screenings: none   Patient concerns: none   Nurse concerns: none   Next PCP appt: 02/12/2020 @ 8:30 am

## 2020-02-06 NOTE — Patient Instructions (Signed)
[name] , Thank you for taking time to come for your Medicare Wellness Visit. I appreciate your ongoing commitment to your health goals. Please review the following plan we discussed and let me know if I can assist you in the future.   Screening recommendations/referrals: Colonoscopy: Up to date, completed 08/04/2015, due 07/2020 Mammogram: scheduled 02/07/2020 per patient Bone Density: due, discuss with provider at physical  Recommended yearly ophthalmology/optometry visit for glaucoma screening and checkup Recommended yearly dental visit for hygiene and checkup  Vaccinations: Influenza vaccine: Up to date, completed 11/2019, due 09/2020 Pneumococcal vaccine: due, will get at physical  Tdap vaccine: Up to date, completed 03/10/2012, due 02/2022 Shingles vaccine: Completed series   Covid-19:Completed series  Advanced directives: Advance directive discussed with you today. I have provided a copy for you to complete at home and have notarized. Once this is complete please bring a copy in to our office so we can scan it into your chart.  Conditions/risks identified: hypertension  Next appointment: Follow up in one year for your annual wellness visit    Preventive Care 67 Years and Older, Female Preventive care refers to lifestyle choices and visits with your health care provider that can promote health and wellness. What does preventive care include?  A yearly physical exam. This is also called an annual well check.  Dental exams once or twice a year.  Routine eye exams. Ask your health care provider how often you should have your eyes checked.  Personal lifestyle choices, including:  Daily care of your teeth and gums.  Regular physical activity.  Eating a healthy diet.  Avoiding tobacco and drug use.  Limiting alcohol use.  Practicing safe sex.  Taking low-dose aspirin every day.  Taking vitamin and mineral supplements as recommended by your health care provider. What  happens during an annual well check? The services and screenings done by your health care provider during your annual well check will depend on your age, overall health, lifestyle risk factors, and family history of disease. Counseling  Your health care provider may ask you questions about your:  Alcohol use.  Tobacco use.  Drug use.  Emotional well-being.  Home and relationship well-being.  Sexual activity.  Eating habits.  History of falls.  Memory and ability to understand (cognition).  Work and work Statistician.  Reproductive health. Screening  You may have the following tests or measurements:  Height, weight, and BMI.  Blood pressure.  Lipid and cholesterol levels. These may be checked every 5 years, or more frequently if you are over 31 years old.  Skin check.  Lung cancer screening. You may have this screening every year starting at age 67 if you have a 30-pack-year history of smoking and currently smoke or have quit within the past 15 years.  Fecal occult blood test (FOBT) of the stool. You may have this test every year starting at age 67.  Flexible sigmoidoscopy or colonoscopy. You may have a sigmoidoscopy every 5 years or a colonoscopy every 10 years starting at age 41.  Hepatitis C blood test.  Hepatitis B blood test.  Sexually transmitted disease (STD) testing.  Diabetes screening. This is done by checking your blood sugar (glucose) after you have not eaten for a while (fasting). You may have this done every 1-3 years.  Bone density scan. This is done to screen for osteoporosis. You may have this done starting at age 65.  Mammogram. This may be done every 1-2 years. Talk to your health care  provider about how often you should have regular mammograms. Talk with your health care provider about your test results, treatment options, and if necessary, the need for more tests. Vaccines  Your health care provider may recommend certain vaccines, such  as:  Influenza vaccine. This is recommended every year.  Tetanus, diphtheria, and acellular pertussis (Tdap, Td) vaccine. You may need a Td booster every 10 years.  Zoster vaccine. You may need this after age 45.  Pneumococcal 13-valent conjugate (PCV13) vaccine. One dose is recommended after age 67.  Pneumococcal polysaccharide (PPSV23) vaccine. One dose is recommended after age 24. Talk to your health care provider about which screenings and vaccines you need and how often you need them. This information is not intended to replace advice given to you by your health care provider. Make sure you discuss any questions you have with your health care provider. Document Released: 02/28/2015 Document Revised: 10/22/2015 Document Reviewed: 12/03/2014 Elsevier Interactive Patient Education  2017 [place] Prevention in the Home Falls can cause injuries. They can happen to people of all ages. There are many things you can do to make your home safe and to help prevent falls. What can I do on the outside of my home?  Regularly fix the edges of walkways and driveways and fix any cracks.  Remove anything that might make you trip as you walk through a door, such as a raised step or threshold.  Trim any bushes or trees on the path to your home.  Use bright outdoor lighting.  Clear any walking paths of anything that might make someone trip, such as rocks or tools.  Regularly check to see if handrails are loose or broken. Make sure that both sides of any steps have handrails.  Any raised decks and porches should have guardrails on the edges.  Have any leaves, snow, or ice cleared regularly.  Use sand or salt on walking paths during winter.  Clean up any spills in your garage right away. This includes oil or grease spills. What can I do in the bathroom?  Use night lights.  Install grab bars by the toilet and in the tub and shower. Do not use towel bars as grab bars.  Use  non-skid mats or decals in the tub or shower.  If you need to sit down in the shower, use a plastic, non-slip stool.  Keep the floor dry. Clean up any water that spills on the floor as soon as it happens.  Remove soap buildup in the tub or shower regularly.  Attach bath mats securely with double-sided non-slip rug tape.  Do not have throw rugs and other things on the floor that can make you trip. What can I do in the bedroom?  Use night lights.  Make sure that you have a light by your bed that is easy to reach.  Do not use any sheets or blankets that are too big for your bed. They should not hang down onto the floor.  Have a firm chair that has side arms. You can use this for support while you get dressed.  Do not have throw rugs and other things on the floor that can make you trip. What can I do in the kitchen?  Clean up any spills right away.  Avoid walking on wet floors.  Keep items that you use a lot in easy-to-reach places.  If you need to reach something above you, use a strong step stool that has a grab bar.  Keep electrical cords out of the way.  Do not use floor polish or wax that makes floors slippery. If you must use wax, use non-skid floor wax.  Do not have throw rugs and other things on the floor that can make you trip. What can I do with my stairs?  Do not leave any items on the stairs.  Make sure that there are handrails on both sides of the stairs and use them. Fix handrails that are broken or loose. Make sure that handrails are as long as the stairways.  Check any carpeting to make sure that it is firmly attached to the stairs. Fix any carpet that is loose or worn.  Avoid having throw rugs at the top or bottom of the stairs. If you do have throw rugs, attach them to the floor with carpet tape.  Make sure that you have a light switch at the top of the stairs and the bottom of the stairs. If you do not have them, ask someone to add them for you. What  else can I do to help prevent falls?  Wear shoes that:  Do not have high heels.  Have rubber bottoms.  Are comfortable and fit you well.  Are closed at the toe. Do not wear sandals.  If you use a stepladder:  Make sure that it is fully opened. Do not climb a closed stepladder.  Make sure that both sides of the stepladder are locked into place.  Ask someone to hold it for you, if possible.  Clearly mark and make sure that you can see:  Any grab bars or handrails.  First and last steps.  Where the edge of each step is.  Use tools that help you move around (mobility aids) if they are needed. These include:  Canes.  Walkers.  Scooters.  Crutches.  Turn on the lights when you go into a dark area. Replace any light bulbs as soon as they burn out.  Set up your furniture so you have a clear path. Avoid moving your furniture around.  If any of your floors are uneven, fix them.  If there are any pets around you, be aware of where they are.  Review your medicines with your doctor. Some medicines can make you feel dizzy. This can increase your chance of falling. Ask your doctor what other things that you can do to help prevent falls. This information is not intended to replace advice given to you by your health care provider. Make sure you discuss any questions you have with your health care provider. Document Released: 11/28/2008 Document Revised: 07/10/2015 Document Reviewed: 03/08/2014 Elsevier Interactive Patient Education  2017 [employer].

## 2020-02-07 ENCOUNTER — Other Ambulatory Visit: Payer: Self-pay

## 2020-02-07 ENCOUNTER — Other Ambulatory Visit (INDEPENDENT_AMBULATORY_CARE_PROVIDER_SITE_OTHER): Payer: Medicare Other

## 2020-02-07 DIAGNOSIS — I10 Essential (primary) hypertension: Secondary | ICD-10-CM | POA: Diagnosis not present

## 2020-02-07 LAB — CBC WITH DIFFERENTIAL/PLATELET
Basophils Absolute: 0.1 10*3/uL (ref 0.0–0.1)
Basophils Relative: 1 % (ref 0.0–3.0)
Eosinophils Absolute: 0.1 10*3/uL (ref 0.0–0.7)
Eosinophils Relative: 2.5 % (ref 0.0–5.0)
HCT: 40.6 % (ref 36.0–46.0)
Hemoglobin: 13.5 g/dL (ref 12.0–15.0)
Lymphocytes Relative: 36.9 % (ref 12.0–46.0)
Lymphs Abs: 1.9 10*3/uL (ref 0.7–4.0)
MCHC: 33.3 g/dL (ref 30.0–36.0)
MCV: 89.8 fl (ref 78.0–100.0)
Monocytes Absolute: 0.4 10*3/uL (ref 0.1–1.0)
Monocytes Relative: 6.8 % (ref 3.0–12.0)
Neutro Abs: 2.7 10*3/uL (ref 1.4–7.7)
Neutrophils Relative %: 52.8 % (ref 43.0–77.0)
Platelets: 256 10*3/uL (ref 150.0–400.0)
RBC: 4.52 Mil/uL (ref 3.87–5.11)
RDW: 14.4 % (ref 11.5–15.5)
WBC: 5.1 10*3/uL (ref 4.0–10.5)

## 2020-02-07 LAB — COMPREHENSIVE METABOLIC PANEL
ALT: 12 U/L (ref 0–35)
AST: 16 U/L (ref 0–37)
Albumin: 4.1 g/dL (ref 3.5–5.2)
Alkaline Phosphatase: 60 U/L (ref 39–117)
BUN: 14 mg/dL (ref 6–23)
CO2: 28 mEq/L (ref 19–32)
Calcium: 9.5 mg/dL (ref 8.4–10.5)
Chloride: 104 mEq/L (ref 96–112)
Creatinine, Ser: 0.75 mg/dL (ref 0.40–1.20)
GFR: 82.63 mL/min (ref >60.00)
Glucose, Bld: 86 mg/dL (ref 70–99)
Potassium: 3.8 mEq/L (ref 3.5–5.1)
Sodium: 140 mEq/L (ref 135–145)
Total Bilirubin: 0.5 mg/dL (ref 0.2–1.2)
Total Protein: 6.7 g/dL (ref 6.0–8.3)

## 2020-02-07 LAB — LIPID PANEL
Cholesterol: 201 mg/dL — ABNORMAL HIGH (ref 0–200)
HDL: 51.9 mg/dL (ref >39.00)
LDL Cholesterol: 136 mg/dL — ABNORMAL HIGH (ref 0–99)
NonHDL: 148.61
Total CHOL/HDL Ratio: 4
Triglycerides: 63 mg/dL (ref 0.0–149.0)
VLDL: 12.6 mg/dL (ref 0.0–40.0)

## 2020-02-07 LAB — TSH: TSH: 1.67 u[IU]/mL (ref 0.35–4.50)

## 2020-02-12 ENCOUNTER — Other Ambulatory Visit: Payer: Self-pay

## 2020-02-12 ENCOUNTER — Ambulatory Visit (INDEPENDENT_AMBULATORY_CARE_PROVIDER_SITE_OTHER): Payer: Medicare Other | Admitting: Family Medicine

## 2020-02-12 ENCOUNTER — Encounter: Payer: Self-pay | Admitting: Family Medicine

## 2020-02-12 VITALS — BP 110/72 | HR 70 | Temp 97.1°F | Ht 62.25 in | Wt 131.2 lb

## 2020-02-12 DIAGNOSIS — Z23 Encounter for immunization: Secondary | ICD-10-CM | POA: Diagnosis not present

## 2020-02-12 DIAGNOSIS — E2839 Other primary ovarian failure: Secondary | ICD-10-CM | POA: Diagnosis not present

## 2020-02-12 DIAGNOSIS — E785 Hyperlipidemia, unspecified: Secondary | ICD-10-CM | POA: Insufficient documentation

## 2020-02-12 DIAGNOSIS — E875 Hyperkalemia: Secondary | ICD-10-CM | POA: Diagnosis not present

## 2020-02-12 DIAGNOSIS — E7800 Pure hypercholesterolemia, unspecified: Secondary | ICD-10-CM | POA: Diagnosis not present

## 2020-02-12 DIAGNOSIS — I10 Essential (primary) hypertension: Secondary | ICD-10-CM | POA: Diagnosis not present

## 2020-02-12 DIAGNOSIS — Z1211 Encounter for screening for malignant neoplasm of colon: Secondary | ICD-10-CM | POA: Diagnosis not present

## 2020-02-12 NOTE — Assessment & Plan Note (Signed)
Now normalized

## 2020-02-12 NOTE — Assessment & Plan Note (Signed)
Disc goals for lipids and reasons to control them Rev last labs with pt Rev low sat fat diet in detail LDL is up to 136 this draw  Will monitor with better diet  If genetic /this may require medication in the future

## 2020-02-12 NOTE — Assessment & Plan Note (Signed)
Due for 6 y recall colonoscopy in 6/22 Aware-will call for ref if needed

## 2020-02-12 NOTE — Assessment & Plan Note (Signed)
bp in fair control at this time  BP Readings from Last 1 Encounters:  02/12/20 110/72   No changes needed/no medications currently Most recent labs reviewed  Disc lifstyle change with low sodium diet and exercise

## 2020-02-12 NOTE — Patient Instructions (Addendum)
For cholesterol Avoid red meat/ fried foods/ egg yolks/ fatty breakfast meats/ butter, cheese and high fat dairy/ and shellfish    Your cholesterol may be genetic as well  We will watch it   Call the breast center and tell them that I put in a bone density test order   Pneumonia vaccine today   Your colonoscopy is due in June  Call us if you need a referral

## 2020-02-12 NOTE — Assessment & Plan Note (Signed)
Ref made for screening dexa  Disc imp of ca and D and exercise for bone health

## 2020-02-12 NOTE — Progress Notes (Signed)
Subjective:    Patient ID: Jasmine Oliver, female    DOB: Jan 12, 1951, 69 y.o.   MRN: [id number]  This visit occurred during the SARS-CoV-2 public health emergency.  Safety protocols were in place, including screening questions prior to the visit, additional usage of staff PPE, and extensive cleaning of exam room while observing appropriate contact time as indicated for disinfecting solutions.    HPI Pt presents for annual f/u of chronic medical problems   Wt Readings from Last 3 Encounters:  02/12/20 131 lb 3 oz (59.5 kg)  01/17/19 159 lb 2 oz (72.2 kg)  01/17/18 172 lb 8 oz (78.2 kg)   23.80 kg/m Has lost wt with a program E to M  This includes intermittent fasting (only eats for 8 hours of the day) 2 meals per  Day  Protein/vegetables for 8 weeks  Not a lot of fruid  Has done it for 2 sessions back to back  There will be guidelines for mt   She is addicted to sugar  Is hard for her to stop eating it  -has to be careful    Had amw on 12/22 Due for pna 23 and dexa    Mammogram 6/20, has mammogram planned Thursday from the breast center  Self breast exam- no lumps   pna 13 was 12/20   Due for pna 23  Colonoscopy 6/17 -due 6/22  Tdap 12/14 Flu shot 10/21 covid vaccine done with booster  Had shingrix vaccines   Dexa 12/16 -normal [place]-none  Fractures -none  Supplements  2000 iu D3 daily Exercise - following a program minimum of 3 times per week  Ab work on T, TH and cardio (walks briskly) and elliptical    HTN bp is stable today  No cp or palpitations or headaches or edema  No medications currently BP Readings from Last 3 Encounters:  02/12/20 110/72  01/17/19 126/82  01/17/18 122/82    Pulse Readings from Last 3 Encounters:  02/12/20 70  01/17/19 68  01/17/18 60    Cholesterol  Lab Results  Component Value Date   CHOL 201 (H) 02/07/2020   CHOL 168 01/10/2019   CHOL 153 06/15/2017   Lab Results  Component Value Date   HDL 51.90 02/07/2020    HDL 52.30 01/10/2019   HDL 53.50 06/15/2017   Lab Results  Component Value Date   LDLCALC 136 (H) 02/07/2020   LDLCALC 107 (H) 01/10/2019   LDLCALC 91 06/15/2017   Lab Results  Component Value Date   TRIG 63.0 02/07/2020   TRIG 44.0 01/10/2019   TRIG 42.0 06/15/2017   Lab Results  Component Value Date   CHOLHDL 4 02/07/2020   CHOLHDL 3 01/10/2019   CHOLHDL 3 06/15/2017   No results found for: LDLDIRECT LDL is up  Eats a lot of eggs , no other fatty foods  She eats some chicken /fish  Some ground beef Some healthy fats -olive oil  Daughter has very cholesterol   Other labs Results for orders placed or performed in visit on 02/07/20  TSH  Result Value Ref Range   TSH 1.67 0.35 - 4.50 uIU/mL  Lipid panel  Result Value Ref Range   Cholesterol 201 (H) 0 - 200 mg/dL   Triglycerides 40.9 0.0 - 149.0 mg/dL   HDL 81.19 >14.78 mg/dL   VLDL 29.5 0.0 - 62.1 mg/dL   LDL Cholesterol 308 (H) 0 - 99 mg/dL   Total CHOL/HDL Ratio 4    NonHDL 148.61  Comprehensive metabolic panel  Result Value Ref Range   Sodium 140 135 - 145 mEq/L   Potassium 3.8 3.5 - 5.1 mEq/L   Chloride 104 96 - 112 mEq/L   CO2 28 19 - 32 mEq/L   Glucose, Bld 86 70 - 99 mg/dL   BUN 14 6 - 23 mg/dL   Creatinine, Ser 7.02 0.40 - 1.20 mg/dL   Total Bilirubin 0.5 0.2 - 1.2 mg/dL   Alkaline Phosphatase 60 39 - 117 U/L   AST 16 0 - 37 U/L   ALT 12 0 - 35 U/L   Total Protein 6.7 6.0 - 8.3 g/dL   Albumin 4.1 3.5 - 5.2 g/dL   GFR 63.78 >58.85 mL/min   Calcium 9.5 8.4 - 10.5 mg/dL  CBC with Differential/Platelet  Result Value Ref Range   WBC 5.1 4.0 - 10.5 K/uL   RBC 4.52 3.87 - 5.11 Mil/uL   Hemoglobin 13.5 12.0 - 15.0 g/dL   HCT 02.7 74.1 - 28.7 %   MCV 89.8 78.0 - 100.0 fl   MCHC 33.3 30.0 - 36.0 g/dL   RDW 86.7 67.2 - 09.4 %   Platelets 256.0 150.0 - 400.0 K/uL   Neutrophils Relative % 52.8 43.0 - 77.0 %   Lymphocytes Relative 36.9 12.0 - 46.0 %   Monocytes Relative 6.8 3.0 - 12.0 %   Eosinophils  Relative 2.5 0.0 - 5.0 %   Basophils Relative 1.0 0.0 - 3.0 %   Neutro Abs 2.7 1.4 - 7.7 K/uL   Lymphs Abs 1.9 0.7 - 4.0 K/uL   Monocytes Absolute 0.4 0.1 - 1.0 K/uL   Eosinophils Absolute 0.1 0.0 - 0.7 K/uL   Basophils Absolute 0.1 0.0 - 0.1 K/uL    Patient Active Problem List   Diagnosis Date Noted  . Hyperlipidemia 02/12/2020  . Estrogen deficiency 02/12/2020  . Welcome to Medicare preventive visit 01/17/2019  . High potassium 01/17/2019  . Tinnitus 06/22/2017  . Memory changes 06/23/2016  . Colon cancer screening 05/20/2015  . Skin cancer screening 05/20/2015  . Microscopic hematuria 02/05/2015  . GERD (gastroesophageal reflux disease) 10/12/2013  . Insomnia 10/12/2013  . Snoring 08/29/2013  . Obstructive sleep apnea 08/29/2013  . Dyspareunia 08/29/2013  . History of colon polyps 04/13/2011  . Routine general medical examination at a health care facility 12/17/2010  . Essential hypertension 10/27/2009  . STRESS REACTION, ACUTE, WITH EMOTIONAL DISTURBANCE 06/06/2007   Past Medical History:  Diagnosis Date  . Colon polyps   . Elevated BP    without hypertension  . Nevus    on left retina-- is watched by opthy   Past Surgical History:  Procedure Laterality Date  . CESAREAN SECTION    . COLONOSCOPY  2005   in [place]  . TOTAL ABDOMINAL HYSTERECTOMY     Social History   Tobacco Use  . Smoking status: Never Smoker  . Smokeless tobacco: Never Used  Substance Use Topics  . Alcohol use: Yes    Alcohol/week: 0.0 standard drinks    Comment: wine weekly  . Drug use: No   Family History  Problem Relation Age of Onset  . Coronary artery disease Mother   . Heart failure Mother   . Hypertension Father   . Coronary artery disease Father   . Cancer Father        smoker- lung cancer   . Hypertension Sister   . ADD / ADHD Daughter   . Alcohol abuse Son  in rehabilitation  . ADD / ADHD Son   . Colon cancer Neg Hx    No Known Allergies Current Outpatient  Medications on File Prior to Visit  Medication Sig Dispense Refill  . calcium carbonate (OS-CAL) 600 MG TABS tablet Take 1 tablet by mouth daily.    . Cholecalciferol (VITAMIN D) 50 MCG (2000 UT) CAPS Take 1 capsule by mouth every other day.     No current facility-administered medications on file prior to visit.    Review of Systems  Constitutional: Negative for activity change, appetite change, fatigue, fever and unexpected weight change.       Intentional wt loss  HENT: Negative for congestion, ear pain, rhinorrhea, sinus pressure and sore throat.   Eyes: Negative for pain, redness and visual disturbance.  Respiratory: Negative for cough, shortness of breath and wheezing.   Cardiovascular: Negative for chest pain and palpitations.  Gastrointestinal: Negative for abdominal pain, blood in stool, constipation and diarrhea.  Endocrine: Negative for polydipsia and polyuria.  Genitourinary: Negative for dysuria, frequency and urgency.  Musculoskeletal: Negative for arthralgias, back pain and myalgias.  Skin: Negative for pallor and rash.  Allergic/Immunologic: Negative for environmental allergies.  Neurological: Negative for dizziness, syncope and headaches.  Hematological: Negative for adenopathy. Does not bruise/bleed easily.  Psychiatric/Behavioral: Negative for decreased concentration and dysphoric mood. The patient is not nervous/anxious.        Objective:   Physical Exam Constitutional:      General: She is not in acute distress.    Appearance: Normal appearance. She is well-developed and normal weight. She is not ill-appearing or diaphoretic.  HENT:     Head: Normocephalic and atraumatic.     Right Ear: Tympanic membrane, ear canal and external ear normal.     Left Ear: Tympanic membrane, ear canal and external ear normal.     Nose: Nose normal. No congestion.     Mouth/Throat:     Mouth: Mucous membranes are moist.     Pharynx: Oropharynx is clear. No posterior  oropharyngeal erythema.  Eyes:     General: No scleral icterus.    Extraocular Movements: Extraocular movements intact.     Conjunctiva/sclera: Conjunctivae normal.     Pupils: Pupils are equal, round, and reactive to light.  Neck:     Thyroid: No thyromegaly.     Vascular: No carotid bruit or JVD.  Cardiovascular:     Rate and Rhythm: Normal rate and regular rhythm.     Pulses: Normal pulses.     Heart sounds: Normal heart sounds. No gallop.   Pulmonary:     Effort: Pulmonary effort is normal. No respiratory distress.     Breath sounds: Normal breath sounds. No wheezing.     Comments: Good air exch Chest:     Chest wall: No tenderness.  Abdominal:     General: Bowel sounds are normal. There is no distension or abdominal bruit.     Palpations: Abdomen is soft. There is no mass.     Tenderness: There is no abdominal tenderness.     Hernia: No hernia is present.  Genitourinary:    Comments: Breast exam: No mass, nodules, thickening, tenderness, bulging, retraction, inflamation, nipple discharge or skin changes noted.  No axillary or clavicular LA.     Musculoskeletal:        General: No tenderness. Normal range of motion.     Cervical back: Normal range of motion and neck supple. No rigidity. No muscular tenderness.  Right lower leg: No edema.     Left lower leg: No edema.     Comments: No kyphosis   Lymphadenopathy:     Cervical: No cervical adenopathy.  Skin:    General: Skin is warm and dry.     Coloration: Skin is not pale.     Findings: No erythema or rash.  Neurological:     Mental Status: She is alert. Mental status is at baseline.     Cranial Nerves: No cranial nerve deficit.     Motor: No abnormal muscle tone.     Coordination: Coordination normal.     Gait: Gait normal.     Deep Tendon Reflexes: Reflexes are normal and symmetric. Reflexes normal.  Psychiatric:        Mood and Affect: Mood normal.        Cognition and Memory: Cognition and memory normal.      Comments: Pleasant  Mentally sharp            Assessment & Plan:   Problem List Items Addressed This Visit      Cardiovascular and Mediastinum   Essential hypertension - Primary    bp in fair control at this time  BP Readings from Last 1 Encounters:  02/12/20 110/72   No changes needed/no medications currently Most recent labs reviewed  Disc lifstyle change with low sodium diet and exercise          Other   Colon cancer screening    Due for 6 y recall colonoscopy in 6/22 Aware-will call for ref if needed      High potassium    Now normalized       Hyperlipidemia    Disc goals for lipids and reasons to control them Rev last labs with pt Rev low sat fat diet in detail LDL is up to 136 this draw  Will monitor with better diet  If genetic /this may require medication in the future       Estrogen deficiency    Ref made for screening dexa  Disc imp of ca and D and exercise for bone health      Relevant Orders   DG Bone Density    Other Visit Diagnoses    Need for 23-polyvalent pneumococcal polysaccharide vaccine       Relevant Orders   Pneumococcal polysaccharide vaccine 23-valent greater than or equal to 2yo subcutaneous/IM (Completed)

## 2020-02-14 ENCOUNTER — Ambulatory Visit
Admission: RE | Admit: 2020-02-14 | Discharge: 2020-02-14 | Disposition: A | Discharge status: Home / Self Care | Payer: Medicare Other | Source: Ambulatory Visit | Attending: Family Medicine | Admitting: Family Medicine

## 2020-02-14 ENCOUNTER — Other Ambulatory Visit: Payer: Self-pay

## 2020-02-14 DIAGNOSIS — M8589 Other specified disorders of bone density and structure, multiple sites: Secondary | ICD-10-CM | POA: Diagnosis not present

## 2020-02-14 DIAGNOSIS — E2839 Other primary ovarian failure: Secondary | ICD-10-CM

## 2020-02-14 DIAGNOSIS — Z780 Asymptomatic menopausal state: Secondary | ICD-10-CM | POA: Diagnosis not present

## 2020-02-14 DIAGNOSIS — Z1231 Encounter for screening mammogram for malignant neoplasm of breast: Secondary | ICD-10-CM | POA: Diagnosis not present

## 2020-02-19 ENCOUNTER — Encounter: Payer: Self-pay | Admitting: Family Medicine

## 2020-02-20 ENCOUNTER — Other Ambulatory Visit: Payer: Medicare Other

## 2020-02-20 ENCOUNTER — Other Ambulatory Visit: Payer: Self-pay

## 2020-02-20 DIAGNOSIS — Z20822 Contact with and (suspected) exposure to covid-19: Secondary | ICD-10-CM

## 2020-02-23 LAB — SPECIMEN STATUS REPORT

## 2020-02-23 LAB — NOVEL CORONAVIRUS, NAA: SARS-CoV-2, NAA: NOT DETECTED

## 2020-02-23 LAB — SARS-COV-2, NAA 2 DAY TAT

## 2020-05-05 ENCOUNTER — Telehealth (INDEPENDENT_AMBULATORY_CARE_PROVIDER_SITE_OTHER): Payer: Medicare Other | Admitting: Family Medicine

## 2020-05-05 ENCOUNTER — Encounter: Payer: Self-pay | Admitting: Family Medicine

## 2020-05-05 ENCOUNTER — Other Ambulatory Visit: Payer: Self-pay

## 2020-05-05 DIAGNOSIS — R058 Other specified cough: Secondary | ICD-10-CM | POA: Diagnosis not present

## 2020-05-05 MED ORDER — PROMETHAZINE-DM 6.25-15 MG/5ML PO SYRP: 5.0000 mL | ORAL_SOLUTION | Freq: Four times a day (QID) | ORAL | 0 refills | Status: DC | PRN

## 2020-05-05 MED ORDER — FLOVENT HFA 110 MCG/ACT IN AERO: 2.0000 | INHALATION_SPRAY | Freq: Two times a day (BID) | RESPIRATORY_TRACT | 1 refills | Status: DC

## 2020-05-05 NOTE — Telephone Encounter (Signed)
Patient has appointment with Dr. Wendling today 

## 2020-05-05 NOTE — Progress Notes (Signed)
Chief Complaint  Patient presents with  . Cough    Jasmine Oliver here for URI complaints. Due to COVID-19 pandemic, we are interacting via web portal for an electronic face-to-face visit. I verified patient's ID using 2 identifiers. Patient agreed to proceed with visit via this method. Patient is at home, I am at office. Patient and I are present for visit.   Duration: 11 days  Associated symptoms: dry cough; has had resolution of over URI s/s's Denies: sinus congestion, sinus pain, rhinorrhea, itchy watery eyes, ear pain, ear drainage, sore throat, wheezing, shortness of breath, myalgia and fevers Treatment to date: Dayquil, Delsym Sick contacts: No  Tested neg for covid.   Past Medical History:  Diagnosis Date  . Colon polyps   . Elevated BP    without hypertension  . Nevus    on left retina-- is watched by opthy   Objective No conversational dyspnea Age appropriate judgment and insight Nml affect and mood  Post-viral cough syndrome - Plan: fluticasone (FLOVENT HFA) 110 MCG/ACT inhaler, promethazine-dextromethorphan (PROMETHAZINE-DM) 6.25-15 MG/5ML syrup  Start ICS. Syrup as needed. Warned about drowsiness. Warned about rinsing mouth out after ICS.  Continue to push fluids, practice good hand hygiene, cover mouth when coughing. F/u prn. If starting to experience fevers, shaking, or shortness of breath, seek immediate care. Pt voiced understanding and agreement to the plan.  [place], DO 05/05/20 11:14 AM

## 2020-05-05 NOTE — Telephone Encounter (Signed)
Will route to [place] to see if she can get her a virtual visit with another provider.

## 2020-05-09 ENCOUNTER — Other Ambulatory Visit: Payer: Self-pay | Admitting: Family Medicine

## 2020-05-12 ENCOUNTER — Other Ambulatory Visit: Payer: Medicare Other

## 2020-05-16 ENCOUNTER — Encounter: Payer: Medicare Other | Admitting: Family Medicine

## 2020-05-19 ENCOUNTER — Ambulatory Visit: Payer: 59 | Admitting: Family Medicine

## 2020-06-05 ENCOUNTER — Encounter: Payer: Self-pay | Admitting: Family Medicine

## 2020-06-17 ENCOUNTER — Telehealth: Payer: Self-pay | Admitting: Family Medicine

## 2020-06-17 DIAGNOSIS — Z1211 Encounter for screening for malignant neoplasm of colon: Secondary | ICD-10-CM

## 2020-06-17 NOTE — Telephone Encounter (Signed)
Jasmine Oliver called in wanted to know about getting a referral for colonoscopy due to its been 12yrs and the name of the doctor is  Wilfrid Lund in [place].  Please advise

## 2020-06-17 NOTE — Telephone Encounter (Signed)
Referral done

## 2020-07-07 ENCOUNTER — Encounter: Payer: Self-pay | Admitting: Gastroenterology

## 2020-08-11 ENCOUNTER — Encounter: Payer: Self-pay | Admitting: Family Medicine

## 2020-08-25 ENCOUNTER — Other Ambulatory Visit: Payer: Self-pay

## 2020-08-25 ENCOUNTER — Ambulatory Visit (AMBULATORY_SURGERY_CENTER): Payer: Medicare Other | Admitting: *Deleted

## 2020-08-25 VITALS — Ht 62.0 in | Wt 130.0 lb

## 2020-08-25 DIAGNOSIS — Z86010 Personal history of colonic polyps: Secondary | ICD-10-CM

## 2020-08-25 MED ORDER — PEG 3350-KCL-NA BICARB-NACL 420 G PO SOLR: 4000.0000 mL | Freq: Once | ORAL | 0 refills | Status: AC

## 2020-08-25 NOTE — Progress Notes (Signed)
Virtual pre visit completed. Secure instructions sent through [place] and secure email [email or web address]  No egg or soy allergy known to patient  No issues with past sedation with any surgeries or procedures Patient denies ever being told they had issues or difficulty with intubation  No FH of Malignant Hyperthermia No diet pills per patient No home 02 use per patient  No blood thinners per patient  Pt denies issues with constipation  No A fib or A flutter  EMMI video to pt or via [place] 19 guidelines implemented in [place] today with Pt and RN  Pt is fully vaccinated  for Covid    Due to the COVID-19 pandemic we are asking patients to follow certain guidelines.  Pt aware of COVID protocols and [facility] guidelines

## 2020-09-12 ENCOUNTER — Other Ambulatory Visit: Payer: Self-pay

## 2020-09-12 ENCOUNTER — Encounter: Payer: Self-pay | Admitting: Gastroenterology

## 2020-09-12 ENCOUNTER — Ambulatory Visit (AMBULATORY_SURGERY_CENTER): Payer: Medicare Other | Admitting: Gastroenterology

## 2020-09-12 VITALS — BP 119/64 | HR 147 | Temp 97.7°F | Resp 10 | Ht 62.0 in | Wt 130.0 lb

## 2020-09-12 DIAGNOSIS — D122 Benign neoplasm of ascending colon: Secondary | ICD-10-CM

## 2020-09-12 DIAGNOSIS — Z86010 Personal history of colonic polyps: Secondary | ICD-10-CM | POA: Diagnosis not present

## 2020-09-12 DIAGNOSIS — D120 Benign neoplasm of cecum: Secondary | ICD-10-CM | POA: Diagnosis not present

## 2020-09-12 DIAGNOSIS — D123 Benign neoplasm of transverse colon: Secondary | ICD-10-CM | POA: Diagnosis not present

## 2020-09-12 DIAGNOSIS — K635 Polyp of colon: Secondary | ICD-10-CM | POA: Diagnosis not present

## 2020-09-12 MED ORDER — SODIUM CHLORIDE 0.9 % IV SOLN: 500.0000 mL | Freq: Once | INTRAVENOUS | Status: DC

## 2020-09-12 NOTE — Op Note (Signed)
[place] Patient Name: Jasmine Oliver Procedure Date: 09/12/2020 8:26 AM MRN: [id number] Endoscopist: [place]. Loletha Carrow , MD Age: 70 Referring MD:  Date of Birth: [birth year] Gender: Female Account #: [id number] Procedure:                Colonoscopy Indications:              Surveillance: Personal history of adenomatous                            polyps on last colonoscopy 5 years ago                           74m TA ; 07/2015 Medicines:                Monitored Anesthesia Care Procedure:                Pre-Anesthesia Assessment:                           - Prior to the procedure, a History and Physical                            was performed, and patient medications and                            allergies were reviewed. The patient's tolerance of                            previous anesthesia was also reviewed. The risks                            and benefits of the procedure and the sedation                            options and risks were discussed with the patient.                            All questions were answered, and informed consent                            was obtained. Prior Anticoagulants: The patient has                            taken no previous anticoagulant or antiplatelet                            agents. ASA Grade Assessment: II - A patient with                            mild systemic disease. After reviewing the risks                            and benefits, the patient was deemed in  satisfactory condition to undergo the procedure.                           After obtaining informed consent, the colonoscope                            was passed under direct vision. Throughout the                            procedure, the patient's blood pressure, pulse, and                            oxygen saturations were monitored continuously. The                            CF [id number] was introduced through the anus                             and advanced to the the cecum, identified by                            appendiceal orifice and ileocecal valve. The                            colonoscopy was performed without difficulty. The                            patient tolerated the procedure well. The quality                            of the bowel preparation was excellent. The                            ileocecal valve, appendiceal orifice, and rectum                            were photographed. The bowel preparation used was                            GoLYTELY. Scope In: 8:34:08 AM Scope Out: 8:51:39 AM Scope Withdrawal Time: 0 hours 13 minutes 30 seconds  Total Procedure Duration: 0 hours 17 minutes 31 seconds  Findings:                 The perianal and digital rectal examinations were                            normal.                           A 2 mm polyp was found in the cecum. The polyp was                            semi-sessile. The polyp was removed with a cold  biopsy forceps. Resection and retrieval were                            complete.                           A 3 mm polyp was found in the distal transverse                            colon. The polyp was flat. The polyp was removed                            with a cold biopsy forceps. Resection and retrieval                            were complete.                           The exam was otherwise without abnormality on                            direct and retroflexion views. Complications:            No immediate complications. Estimated Blood Loss:     Estimated blood loss was minimal. Impression:               - One 2 mm polyp in the cecum, removed with a cold                            biopsy forceps. Resected and retrieved.                           - One 3 mm polyp in the distal transverse colon,                            removed with a cold biopsy forceps. Resected and                             retrieved.                           - The examination was otherwise normal on direct                            and retroflexion views. Recommendation:           - Patient has a contact number available for                            emergencies. The signs and symptoms of potential                            delayed complications were discussed with the                            patient. Return to  normal activities tomorrow.                            Written discharge instructions were provided to the                            patient.                           - Resume previous diet.                           - Continue present medications.                           - Await pathology results.                           - Repeat colonoscopy is recommended for                            surveillance. The colonoscopy date will be                            determined after pathology results from today's                            exam become available for review. Umar Patmon L. Loletha Carrow, MD 09/12/2020 8:54:29 AM This report has been signed electronically.

## 2020-09-12 NOTE — Progress Notes (Signed)
pt tolerated well. VSS. awake and to recovery. Report given to RN.  

## 2020-09-12 NOTE — Patient Instructions (Signed)
Resume previous diet and medications.Awaiting pathology. Repeat colonoscopy to be determined after path results are back.  YOU HAD AN ENDOSCOPIC PROCEDURE TODAY AT [facility]:   Refer to the procedure report that was given to you for any specific questions about what was found during the examination.  If the procedure report does not answer your questions, please call your gastroenterologist to clarify.  If you requested that your care partner not be given the details of your procedure findings, then the procedure report has been included in a sealed envelope for you to review at your convenience later.  YOU SHOULD EXPECT: Some feelings of bloating in the abdomen. Passage of more gas than usual.  Walking can help get rid of the air that was put into your GI tract during the procedure and reduce the bloating. If you had a lower endoscopy (such as a colonoscopy or flexible sigmoidoscopy) you may notice spotting of blood in your stool or on the toilet paper. If you underwent a bowel prep for your procedure, you may not have a normal bowel movement for a few days.  Please Note:  You might notice some irritation and congestion in your nose or some drainage.  This is from the oxygen used during your procedure.  There is no need for concern and it should clear up in a day or so.  SYMPTOMS TO REPORT IMMEDIATELY:  Following lower endoscopy (colonoscopy or flexible sigmoidoscopy):  Excessive amounts of blood in the stool  Significant tenderness or worsening of abdominal pains  Swelling of the abdomen that is new, acute  Fever of 100F or higher  For urgent or emergent issues, a gastroenterologist can be reached at any hour by calling [phone]. Do not use MyChart messaging for urgent concerns.    DIET:  We do recommend a small meal at first, but then you may proceed to your regular diet.  Drink plenty of fluids but you should avoid alcoholic beverages for 24 hours.  ACTIVITY:  You  should plan to take it easy for the rest of today and you should NOT DRIVE or use heavy machinery until tomorrow (because of the sedation medicines used during the test).    FOLLOW UP: Our staff will call the number listed on your records 48-72 hours following your procedure to check on you and address any questions or concerns that you may have regarding the information given to you following your procedure. If we do not reach you, we will leave a message.  We will attempt to reach you two times.  During this call, we will ask if you have developed any symptoms of COVID 19. If you develop any symptoms (ie: fever, flu-like symptoms, shortness of breath, cough etc.) before then, please call [phone].  If you test positive for Covid 19 in the 2 weeks post procedure, please call and report this information to [place].    If any biopsies were taken you will be contacted by phone or by letter within the next 1-3 weeks.  Please call us at [phone] if you have not heard about the biopsies in 3 weeks.    SIGNATURES/CONFIDENTIALITY: You and/or your care partner have signed paperwork which will be entered into your electronic medical record.  These signatures attest to the fact that that the information above on your After Visit Summary has been reviewed and is understood.  Full responsibility of the confidentiality of this discharge information lies with you and/or your care-partner.

## 2020-09-12 NOTE — Progress Notes (Signed)
Called to room to assist during endoscopic procedure.  Patient ID and intended procedure confirmed with present staff. Received instructions for my participation in the procedure from the performing physician.  

## 2020-09-16 ENCOUNTER — Telehealth: Payer: Self-pay | Admitting: *Deleted

## 2020-09-16 ENCOUNTER — Telehealth: Payer: Self-pay

## 2020-09-16 NOTE — Telephone Encounter (Signed)
  Follow up Call-  Call back number 09/12/2020  Post procedure Call Back phone  # [phone]  Permission to leave phone message Yes  Some recent data might be hidden     Patient questions:  Do you have a fever, pain , or abdominal swelling? No. Pain Score  0 *  Have you tolerated food without any problems? Yes.    Have you been able to return to your normal activities? Yes.    Do you have any questions about your discharge instructions: Diet   No. Medications  No. Follow up visit  No.  Do you have questions or concerns about your Care? No.  Actions: * If pain score is 4 or above: No action needed, pain <4.  Have you developed a fever since your procedure? no  2.   Have you had an respiratory symptoms (SOB or cough) since your procedure? no  3.   Have you tested positive for COVID 19 since your procedure no  4.   Have you had any family members/close contacts diagnosed with the COVID 19 since your procedure?  no   If yes to any of these questions please route to Joylene John, RN and Joella Prince, RN

## 2020-09-16 NOTE — Telephone Encounter (Signed)
  Follow up Call-  Call back number 09/12/2020  Post procedure Call Back phone  # [phone]  Permission to leave phone message Yes  Some recent data might be hidden     Left message

## 2020-09-17 ENCOUNTER — Encounter: Payer: Self-pay | Admitting: Gastroenterology

## 2020-10-27 ENCOUNTER — Encounter: Payer: Self-pay | Admitting: Family Medicine

## 2020-12-03 ENCOUNTER — Encounter: Payer: Self-pay | Admitting: Family Medicine

## 2021-02-03 ENCOUNTER — Telehealth: Payer: Self-pay | Admitting: Family Medicine

## 2021-02-03 DIAGNOSIS — I10 Essential (primary) hypertension: Secondary | ICD-10-CM

## 2021-02-03 DIAGNOSIS — E7800 Pure hypercholesterolemia, unspecified: Secondary | ICD-10-CM

## 2021-02-03 NOTE — Telephone Encounter (Signed)
-----   Message from Ellamae Sia sent at 01/26/2021  3:18 PM EST ----- Regarding: Lab orders for Wednesday, 12.21.22 Patient is scheduled for CPX labs, please order future labs, Thanks , Karna Christmas

## 2021-02-04 ENCOUNTER — Other Ambulatory Visit (INDEPENDENT_AMBULATORY_CARE_PROVIDER_SITE_OTHER): Payer: Medicare Other

## 2021-02-04 ENCOUNTER — Other Ambulatory Visit: Payer: Self-pay

## 2021-02-04 DIAGNOSIS — E7800 Pure hypercholesterolemia, unspecified: Secondary | ICD-10-CM

## 2021-02-04 DIAGNOSIS — I10 Essential (primary) hypertension: Secondary | ICD-10-CM

## 2021-02-04 LAB — CBC WITH DIFFERENTIAL/PLATELET
Basophils Absolute: 0 10*3/uL (ref 0.0–0.1)
Basophils Relative: 0.8 % (ref 0.0–3.0)
Eosinophils Absolute: 0.1 10*3/uL (ref 0.0–0.7)
Eosinophils Relative: 2 % (ref 0.0–5.0)
HCT: 41 % (ref 36.0–46.0)
Hemoglobin: 13.6 g/dL (ref 12.0–15.0)
Lymphocytes Relative: 41.4 % (ref 12.0–46.0)
Lymphs Abs: 2.3 10*3/uL (ref 0.7–4.0)
MCHC: 33.3 g/dL (ref 30.0–36.0)
MCV: 89.3 fl (ref 78.0–100.0)
Monocytes Absolute: 0.4 10*3/uL (ref 0.1–1.0)
Monocytes Relative: 7.5 % (ref 3.0–12.0)
Neutro Abs: 2.7 10*3/uL (ref 1.4–7.7)
Neutrophils Relative %: 48.3 % (ref 43.0–77.0)
Platelets: 232 10*3/uL (ref 150.0–400.0)
RBC: 4.59 Mil/uL (ref 3.87–5.11)
RDW: 14.5 % (ref 11.5–15.5)
WBC: 5.5 10*3/uL (ref 4.0–10.5)

## 2021-02-04 LAB — LIPID PANEL
Cholesterol: 178 mg/dL (ref 0–200)
HDL: 62 mg/dL (ref >39.00)
LDL Cholesterol: 103 mg/dL — ABNORMAL HIGH (ref 0–99)
NonHDL: 115.86
Total CHOL/HDL Ratio: 3
Triglycerides: 66 mg/dL (ref 0.0–149.0)
VLDL: 13.2 mg/dL (ref 0.0–40.0)

## 2021-02-04 LAB — COMPREHENSIVE METABOLIC PANEL
ALT: 12 U/L (ref 0–35)
AST: 18 U/L (ref 0–37)
Albumin: 4 g/dL (ref 3.5–5.2)
Alkaline Phosphatase: 74 U/L (ref 39–117)
BUN: 17 mg/dL (ref 6–23)
CO2: 30 mEq/L (ref 19–32)
Calcium: 9.6 mg/dL (ref 8.4–10.5)
Chloride: 102 mEq/L (ref 96–112)
Creatinine, Ser: 0.75 mg/dL (ref 0.40–1.20)
GFR: 82.06 mL/min (ref >60.00)
Glucose, Bld: 85 mg/dL (ref 70–99)
Potassium: 4.2 mEq/L (ref 3.5–5.1)
Sodium: 138 mEq/L (ref 135–145)
Total Bilirubin: 0.5 mg/dL (ref 0.2–1.2)
Total Protein: 6.7 g/dL (ref 6.0–8.3)

## 2021-02-04 LAB — TSH: TSH: 1.62 u[IU]/mL (ref 0.35–5.50)

## 2021-02-05 NOTE — Progress Notes (Signed)
Subjective:   Jasmine Oliver is a 70 y.o. female who presents for Medicare Annual (Subsequent) preventive examination.  I connected with Jasmine Oliver today by telephone and verified that I am speaking with the correct person using two identifiers. Location patient: home Location provider: work Persons participating in the virtual visit: patient, Marine scientist.    I discussed the limitations, risks, security and privacy concerns of performing an evaluation and management service by telephone and the availability of in person appointments. I also discussed with the patient that there may be a patient responsible charge related to this service. The patient expressed understanding and verbally consented to this telephonic visit.    Interactive audio and video telecommunications were attempted between this provider and patient, however failed, due to patient having technical difficulties OR patient did not have access to video capability.  We continued and completed visit with audio only.  Some vital signs may be absent or patient reported.   Time Spent with patient on telephone encounter: 20 minutes  Review of Systems     Cardiac Risk Factors include: advanced age (>39men, >53 women);hypertension;dyslipidemia     Objective:    Today's Vitals   02/06/21 0857  Weight: 134 lb (60.8 kg)  Height: 5\' 2"  (1.575 m)   Body mass index is 24.51 kg/m.  Advanced Directives 02/06/2021 02/06/2020 08/04/2015 07/21/2015 02/18/2014  Does Patient Have a Medical Advance Directive? Yes No No No No  Type of Paramedic of [place];Living will - - - -  Does patient want to make changes to medical advance directive? Yes (MAU/Ambulatory/Procedural Areas - Information given) - - - -  Would patient like information on creating a medical advance directive? - Yes (MAU/Ambulatory/Procedural Areas - Information given) No - patient declined information - No - patient declined information    Current  Medications (verified) Outpatient Encounter Medications as of 02/06/2021  Medication Sig   calcium carbonate (OS-CAL) 600 MG TABS tablet Take 1 tablet by mouth daily.   Cholecalciferol (VITAMIN D) 50 MCG (2000 UT) CAPS Take 1 capsule by mouth every other day.   promethazine-dextromethorphan (PROMETHAZINE-DM) 6.25-15 MG/5ML syrup Take 5 mLs by mouth 4 (four) times daily as needed for cough. (Patient not taking: Reported on 08/25/2020)   Facility-Administered Encounter Medications as of 02/06/2021  Medication   0.9 %  sodium chloride infusion    Allergies (verified) Patient has no known allergies.   History: Past Medical History:  Diagnosis Date   Colon polyps    Elevated BP    without hypertension   Nevus    on left retina-- is watched by opthy   Past Surgical History:  Procedure Laterality Date   CESAREAN SECTION     COLONOSCOPY  2005   in [place]     Family History  Problem Relation Age of Onset   Coronary artery disease Mother    Heart failure Mother    Hypertension Father    Coronary artery disease Father    Cancer Father        smoker- lung cancer    Hypertension Sister    ADD / ADHD Daughter    Alcohol abuse Son        in rehabilitation   ADD / ADHD Son    Colon cancer Neg Hx    Colon polyps Neg Hx    Esophageal cancer Neg Hx    Rectal cancer Neg Hx    Stomach cancer Neg Hx  Social History   Socioeconomic History   Marital status: Married    Spouse name: Not on file   Number of children: 4   Years of education: Not on file   Highest education level: Not on file  Occupational History   Occupation: Sales promotion account executive  Tobacco Use   Smoking status: Never   Smokeless tobacco: Never  Substance and Sexual Activity   Alcohol use: Yes    Alcohol/week: 0.0 standard drinks    Comment: wine weekly   Drug use: No   Sexual activity: Not on file  Other Topics Concern   Not on file  Social History Narrative   Plays Golf and  goes to the gym   Social Determinants of Health   Financial Resource Strain: Low Risk    Difficulty of Paying Living Expenses: Not hard at all  Food Insecurity: No Food Insecurity   Worried About Charity fundraiser in the Last Year: Never true   Arboriculturist in the Last Year: Never true  Transportation Needs: No Transportation Needs   Lack of Transportation (Medical): No   Lack of Transportation (Non-Medical): No  Physical Activity: Sufficiently Active   Days of Exercise per Week: 7 days   Minutes of Exercise per Session: 60 min  Stress: No Stress Concern Present   Feeling of Stress : Only a little  Social Connections: Moderately Integrated   Frequency of Communication with Friends and Family: More than three times a week   Frequency of Social Gatherings with Friends and Family: More than three times a week   Attends Religious Services: Never   Marine scientist or Organizations: Yes   Attends Music therapist: More than 4 times per year   Marital Status: Married    Tobacco Counseling Counseling given: Not Answered   Clinical Intake:  Pre-visit preparation completed: Yes  Pain : No/denies pain     BMI - recorded: 24.51 Nutritional Status: BMI of 19-24  Normal Nutritional Risks: None Diabetes: No  How often do you need to have someone help you when you read instructions, pamphlets, or other written materials from your doctor or pharmacy?: 1 - Never  Diabetic?No  Interpreter Needed?: No  Information entered by :: Orrin Brigham LPN   Activities of Daily Living In your present state of health, do you have any difficulty performing the following activities: 02/06/2021  Hearing? N  Vision? Y  Difficulty concentrating or making decisions? N  Walking or climbing stairs? N  Dressing or bathing? N  Doing errands, shopping? N  Preparing Food and eating ? N  Using the Toilet? N  In the past six months, have you accidently leaked urine? N  Do  you have problems with loss of bowel control? N  Managing your Medications? N  Managing your Finances? N  Housekeeping or managing your Housekeeping? N  Some recent data might be hidden    Patient Care Team: Tower, Wynelle Fanny, MD as PCP - General  Indicate any recent Medical Services you may have received from other than [facility] providers in the past year (date may be approximate).     Assessment:   This is a routine wellness examination for Jasmine Oliver.  Hearing/Vision screen Hearing Screening - Comments:: No issues Vision Screening - Comments:: Last exam 12/2020, Dr. Glennon Mac @ [facility] , wears contacts  Dietary issues and exercise activities discussed: Current Exercise Habits: Home exercise routine, Type of exercise: walking, Time (Minutes): 60 (walking when playing golf  3 days a week for 2+  hours), Frequency (Times/Week): 7, Weekly Exercise (Minutes/Week): 420, Intensity: Intense   Goals Addressed             This Visit's Progress    Patient Stated       Would like to maintain current weight       Depression Screen PHQ 2/9 Scores 02/06/2021 02/06/2020 01/17/2019 06/20/2017  PHQ - 2 Score 0 0 0 1  PHQ- 9 Score - 0 - -    Fall Risk Fall Risk  02/06/2021 02/06/2020 01/17/2019  Falls in the past year? 1 0 0  Number falls in past yr: 0 0 -  Injury with Fall? 1 0 -  Risk for fall due to : Other (Comment) No Fall Risks -  Risk for fall due to: Comment tripped on hiking trail - -  Follow up Falls prevention discussed Falls evaluation completed;Falls prevention discussed Falls evaluation completed    FALL RISK PREVENTION PERTAINING TO THE HOME:  Any stairs in or around the home? Yes  If so, are there any without handrails? Yes  Home free of loose throw rugs in walkways, pet beds, electrical cords, etc? Yes  Adequate lighting in your home to reduce risk of falls? Yes   ASSISTIVE DEVICES UTILIZED TO PREVENT FALLS:  Life alert? No  Use of a cane, walker or w/c? No  Grab bars  in the bathroom? No  Shower chair or bench in shower? Yes  Elevated toilet seat or a handicapped toilet? Yes   TIMED UP AND GO:  Was the test performed? No , visit completed over the phone.   Cognitive Function: Normal cognitive status assessed by this Nurse Health Advisor. No abnormalities found.   MMSE - Mini Mental State Exam 02/06/2020  Orientation to time 5  Orientation to Place 5  Registration 3  Attention/ Calculation 5  Recall 3  Language- repeat 1        Immunizations Immunization History  Administered Date(s) Administered   Fluad Quad(high Dose 65+) 11/17/2018   Hepatitis A 07/17/2002   Influenza Split 03/10/2012   Influenza Whole 11/16/2006, 10/16/2009   Influenza,inj,Quad PF,6+ Mos 01/20/2017, 01/17/2018   Influenza-Unspecified 01/22/2015, 12/07/2019, 11/17/2020   PFIZER Comirnaty(Gray Top)Covid-19 Tri-Sucrose Vaccine 06/27/2020   PFIZER(Purple Top)SARS-COV-2 Vaccination 04/07/2019, 04/30/2019, 11/09/2019   Pfizer Covid-19 Vaccine Bivalent Booster 80yrs & up 11/17/2020   Pneumococcal Conjugate-13 01/17/2019   Pneumococcal Polysaccharide-23 02/12/2020   Td 07/17/2002   Tdap 03/10/2012   Zoster Recombinat (Shingrix) 12/07/2018, 02/19/2019   Zoster, Live 05/20/2014    TDAP status: Up to date  Flu Vaccine status: Up to date  Pneumococcal vaccine status: Up to date  Covid-19 vaccine status: Completed vaccines  Qualifies for Shingles Vaccine? Yes   Zostavax completed Yes   Shingrix Completed?: Yes  Screening Tests Health Maintenance  Topic Date Due   MAMMOGRAM  02/13/2021   TETANUS/TDAP  03/10/2022   COLONOSCOPY (Pts 45-27yrs Insurance coverage will need to be confirmed)  09/12/2025   Pneumonia Vaccine 17+ Years old  Completed   INFLUENZA VACCINE  Completed   DEXA SCAN  Completed   COVID-19 Vaccine  Completed   Hepatitis C Screening  Completed   Zoster Vaccines- Shingrix  Completed   HPV VACCINES  Aged Out    Health Maintenance  There are  no preventive care reminders to display for this patient.  Colorectal cancer screening: Type of screening: Colonoscopy. Completed 09/12/20. Repeat every 5 years  Mammogram status: Completed 02/14/20. Repeat every  year  Bone Density status: Completed 02/14/20. Results reflect: Bone density results: OSTEOPOROSIS. Repeat every 2 years.  Lung Cancer Screening: (Low Dose CT Chest recommended if Age 24-80 years, 30 pack-year currently smoking OR have quit w/in 15years.) does not qualify.     Additional Screening:  Hepatitis C Screening: does qualify; Completed 05/20/15  Vision Screening: Recommended annual ophthalmology exams for early detection of glaucoma and other disorders of the eye. Is the patient up to date with their annual eye exam?  Yes   Who is the provider or what is the name of the office in which the patient attends annual eye exams? Dr. Glennon Mac   Dental Screening: Recommended annual dental exams for proper oral hygiene  Community Resource Referral / Chronic Care Management: CRR required this visit?  No   CCM required this visit?  No      Plan:     I have personally reviewed and noted the following in the patients chart:   Medical and social history Use of alcohol, tobacco or illicit drugs  Current medications and supplements including opioid prescriptions.  Functional ability and status Nutritional status Physical activity Advanced directives List of other physicians Hospitalizations, surgeries, and ER visits in previous 12 months Vitals Screenings to include cognitive, depression, and falls Referrals and appointments  In addition, I have reviewed and discussed with patient certain preventive protocols, quality metrics, and best practice recommendations. A written personalized care plan for preventive services as well as general preventive health recommendations were provided to patient.   Due to this being a telephonic visit, the after visit summary with  patients personalized plan was offered to patient via mail or my-chart. Patient would like to access on my-chart.     Loma Messing, LPN   [id number]   Nurse Health Advisor  Nurse Notes: none

## 2021-02-06 ENCOUNTER — Ambulatory Visit (INDEPENDENT_AMBULATORY_CARE_PROVIDER_SITE_OTHER): Payer: Medicare Other

## 2021-02-06 VITALS — Ht 62.0 in | Wt 134.0 lb

## 2021-02-06 DIAGNOSIS — Z0000 Encounter for general adult medical examination without abnormal findings: Secondary | ICD-10-CM

## 2021-02-06 DIAGNOSIS — Z1231 Encounter for screening mammogram for malignant neoplasm of breast: Secondary | ICD-10-CM | POA: Diagnosis not present

## 2021-02-06 NOTE — Patient Instructions (Signed)
Jasmine Oliver , Thank you for taking time to complete your Medicare Wellness Visit. I appreciate your ongoing commitment to your health goals. Please review the following plan we discussed and let me know if I can assist you in the future.   Screening recommendations/referrals: Colonoscopy: up to date, completed 09/12/20, due 09/12/25 Mammogram: up to date , completed 02/14/20, ordered today, someone will call to schedule an appointment. Bone Density: up to date, completed 02/14/20 Recommended yearly ophthalmology/optometry visit for glaucoma screening and checkup Recommended yearly dental visit for hygiene and checkup  Vaccinations: Influenza vaccine: up to date Pneumococcal vaccine: up to date Tdap vaccine: up to date, completed 03/10/12, due 03/10/22 Shingles vaccine: up to date   Covid-19:up to date  Advanced directives: Please bring a copy of Living Will and/or [place] for your chart.   Conditions/risks identified: see problem list  Next appointment: Follow up in one year for your annual wellness visit 02/10/22 @ 9:00am , this will be a telephone visit.    Preventive Care 47 Years and Older, Female Preventive care refers to lifestyle choices and visits with your health care provider that can promote health and wellness. What does preventive care include? A yearly physical exam. This is also called an annual well check. Dental exams once or twice a year. Routine eye exams. Ask your health care provider how often you should have your eyes checked. Personal lifestyle choices, including: Daily care of your teeth and gums. Regular physical activity. Eating a healthy diet. Avoiding tobacco and drug use. Limiting alcohol use. Practicing safe sex. Taking low-dose aspirin every day. Taking vitamin and mineral supplements as recommended by your health care provider. What happens during an annual well check? The services and screenings done by your health care  provider during your annual well check will depend on your age, overall health, lifestyle risk factors, and family history of disease. Counseling  Your health care provider may ask you questions about your: Alcohol use. Tobacco use. Drug use. Emotional well-being. Home and relationship well-being. Sexual activity. Eating habits. History of falls. Memory and ability to understand (cognition). Work and work Statistician. Reproductive health. Screening  You may have the following tests or measurements: Height, weight, and BMI. Blood pressure. Lipid and cholesterol levels. These may be checked every 5 years, or more frequently if you are over 60 years old. Skin check. Lung cancer screening. You may have this screening every year starting at age 82 if you have a 30-pack-year history of smoking and currently smoke or have quit within the past 15 years. Fecal occult blood test (FOBT) of the stool. You may have this test every year starting at age 32. Flexible sigmoidoscopy or colonoscopy. You may have a sigmoidoscopy every 5 years or a colonoscopy every 10 years starting at age 65. Hepatitis C blood test. Hepatitis B blood test. Sexually transmitted disease (STD) testing. Diabetes screening. This is done by checking your blood sugar (glucose) after you have not eaten for a while (fasting). You may have this done every 1-3 years. Bone density scan. This is done to screen for osteoporosis. You may have this done starting at age 13. Mammogram. This may be done every 1-2 years. Talk to your health care provider about how often you should have regular mammograms. Talk with your health care provider about your test results, treatment options, and if necessary, the need for more tests. Vaccines  Your health care provider may recommend certain vaccines, such as: Influenza vaccine. This is  recommended every year. Tetanus, diphtheria, and acellular pertussis (Tdap, Td) vaccine. You may need a Td  booster every 10 years. Zoster vaccine. You may need this after age 25. Pneumococcal 13-valent conjugate (PCV13) vaccine. One dose is recommended after age 72. Pneumococcal polysaccharide (PPSV23) vaccine. One dose is recommended after age 69. Talk to your health care provider about which screenings and vaccines you need and how often you need them. This information is not intended to replace advice given to you by your health care provider. Make sure you discuss any questions you have with your health care provider. Document Released: 02/28/2015 Document Revised: 10/22/2015 Document Reviewed: 12/03/2014 Elsevier Interactive Patient Education  2017 [place] Prevention in the Home Falls can cause injuries. They can happen to people of all ages. There are many things you can do to make your home safe and to help prevent falls. What can I do on the outside of my home? Regularly fix the edges of walkways and driveways and fix any cracks. Remove anything that might make you trip as you walk through a door, such as a raised step or threshold. Trim any bushes or trees on the path to your home. Use bright outdoor lighting. Clear any walking paths of anything that might make someone trip, such as rocks or tools. Regularly check to see if handrails are loose or broken. Make sure that both sides of any steps have handrails. Any raised decks and porches should have guardrails on the edges. Have any leaves, snow, or ice cleared regularly. Use sand or salt on walking paths during winter. Clean up any spills in your garage right away. This includes oil or grease spills. What can I do in the bathroom? Use night lights. Install grab bars by the toilet and in the tub and shower. Do not use towel bars as grab bars. Use non-skid mats or decals in the tub or shower. If you need to sit down in the shower, use a plastic, non-slip stool. Keep the floor dry. Clean up any water that spills on the floor  as soon as it happens. Remove soap buildup in the tub or shower regularly. Attach bath mats securely with double-sided non-slip rug tape. Do not have throw rugs and other things on the floor that can make you trip. What can I do in the bedroom? Use night lights. Make sure that you have a light by your bed that is easy to reach. Do not use any sheets or blankets that are too big for your bed. They should not hang down onto the floor. Have a firm chair that has side arms. You can use this for support while you get dressed. Do not have throw rugs and other things on the floor that can make you trip. What can I do in the kitchen? Clean up any spills right away. Avoid walking on wet floors. Keep items that you use a lot in easy-to-reach places. If you need to reach something above you, use a strong step stool that has a grab bar. Keep electrical cords out of the way. Do not use floor polish or wax that makes floors slippery. If you must use wax, use non-skid floor wax. Do not have throw rugs and other things on the floor that can make you trip. What can I do with my stairs? Do not leave any items on the stairs. Make sure that there are handrails on both sides of the stairs and use them. Fix handrails that are  broken or loose. Make sure that handrails are as long as the stairways. Check any carpeting to make sure that it is firmly attached to the stairs. Fix any carpet that is loose or worn. Avoid having throw rugs at the top or bottom of the stairs. If you do have throw rugs, attach them to the floor with carpet tape. Make sure that you have a light switch at the top of the stairs and the bottom of the stairs. If you do not have them, ask someone to add them for you. What else can I do to help prevent falls? Wear shoes that: Do not have high heels. Have rubber bottoms. Are comfortable and fit you well. Are closed at the toe. Do not wear sandals. If you use a stepladder: Make sure that it is  fully opened. Do not climb a closed stepladder. Make sure that both sides of the stepladder are locked into place. Ask someone to hold it for you, if possible. Clearly mark and make sure that you can see: Any grab bars or handrails. First and last steps. Where the edge of each step is. Use tools that help you move around (mobility aids) if they are needed. These include: Canes. Walkers. Scooters. Crutches. Turn on the lights when you go into a dark area. Replace any light bulbs as soon as they burn out. Set up your furniture so you have a clear path. Avoid moving your furniture around. If any of your floors are uneven, fix them. If there are any pets around you, be aware of where they are. Review your medicines with your doctor. Some medicines can make you feel dizzy. This can increase your chance of falling. Ask your doctor what other things that you can do to help prevent falls. This information is not intended to replace advice given to you by your health care provider. Make sure you discuss any questions you have with your health care provider. Document Released: 11/28/2008 Document Revised: 07/10/2015 Document Reviewed: 03/08/2014 Elsevier Interactive Patient Education  2017 [employer].

## 2021-02-12 ENCOUNTER — Other Ambulatory Visit: Payer: Self-pay

## 2021-02-12 ENCOUNTER — Encounter: Payer: Medicare Other | Admitting: Family Medicine

## 2021-02-12 ENCOUNTER — Ambulatory Visit (INDEPENDENT_AMBULATORY_CARE_PROVIDER_SITE_OTHER): Payer: Medicare Other | Admitting: Family Medicine

## 2021-02-12 ENCOUNTER — Encounter: Payer: Self-pay | Admitting: Family Medicine

## 2021-02-12 VITALS — BP 136/78 | HR 63 | Temp 97.4°F | Ht 62.25 in | Wt 136.5 lb

## 2021-02-12 DIAGNOSIS — Z0000 Encounter for general adult medical examination without abnormal findings: Secondary | ICD-10-CM

## 2021-02-12 DIAGNOSIS — E7800 Pure hypercholesterolemia, unspecified: Secondary | ICD-10-CM

## 2021-02-12 DIAGNOSIS — I10 Essential (primary) hypertension: Secondary | ICD-10-CM | POA: Diagnosis not present

## 2021-02-12 NOTE — Patient Instructions (Addendum)
Keep taking care of yourself  Stay active  Eat a low cholesterol diet  Use sun protection   Continue your vitamin D 1000- 2000 iu daily  Calcium also      [facility]  Please call the location of your choice from the menu below to schedule your Mammogram and/or Bone Density appointment.    [facility]                      Phone:  [phone], [place] [address]                                                             Services: Traditional and 3D Mammogram, [place] Bone Density                 Phone: [phone] [address], [place] [address]    Service: Bone Density ONLY   *this site does NOT perform mammograms  [facility]                        Phone:  [phone] [address], [place] [address]                                            Services:  3D Mammogram and [place] at [facility]   Phone:  [phone]   [place], [place] [address]                                            Services: 3D Mammogram and [place]  [facility] at [facility])  Phone:  [phone]   [address]  Room [place], [place] [address]                                              Services:  3D Mammogram and Bone Density

## 2021-02-12 NOTE — Assessment & Plan Note (Signed)
Disc goals for lipids and reasons to control them Rev last labs with pt Rev low sat fat diet in detail Better with diet , LDL back into low 100s

## 2021-02-12 NOTE — Assessment & Plan Note (Signed)
Reviewed health habits including diet and exercise and skin cancer prevention Reviewed appropriate screening tests for age  Also reviewed health mt list, fam hx and immunization status , as well as social and family history   See HPI Labs reviewed  utd imms incl covid booster  dexa is utd (no falls/fx) takes vit D Great exercise and diet, cholesterol improved Mammogram ordered-pt to schedule  Colonoscopy utd

## 2021-02-12 NOTE — Progress Notes (Signed)
Subjective:    Patient ID: Jasmine Oliver, female    DOB: 06-Oct-1950, 70 y.o.   MRN: [id number]  This visit occurred during the SARS-CoV-2 public health emergency.  Safety protocols were in place, including screening questions prior to the visit, additional usage of staff PPE, and extensive cleaning of exam room while observing appropriate contact time as indicated for disinfecting solutions.   HPI Here for health maintenance exam and to review chronic medical problems    Wt Readings from Last 3 Encounters:  02/12/21 136 lb 8 oz (61.9 kg)  02/06/21 134 lb (60.8 kg)  09/12/20 130 lb (59 kg)   24.77 kg/m  Has been feeling fine  Did a lot of traveling this year  Went to [place]   Amw done /reviewed on 12/23  Mammogram 01/2020 and has been ordered  Will schedule  Self breast exam -no lumps or changes    Colonoscopy 08/2020 with 5 y recall   Tdap 2014 Had shingrix vaccine  Pna vaccines utd Flu shot oct 2022 Covid booster oct 2022  Dexa 01/2020-osteopenia Falls -none Fractures-none Supplements -takes vitamin D  Exercise - regular /hiking/walking/ golf   HTN bp is stable today  No cp or palpitations or headaches or edema  Controlled with lifestyle habits  BP Readings from Last 3 Encounters:  02/12/21 136/78  09/12/20 119/64  02/12/20 110/72    No medications currently   Lab Results  Component Value Date   CREATININE 0.75 02/04/2021   BUN 17 02/04/2021   NA 138 02/04/2021   K 4.2 02/04/2021   CL 102 02/04/2021   CO2 30 02/04/2021    Hyperlipidemia Lab Results  Component Value Date   CHOL 178 02/04/2021   CHOL 201 (H) 02/07/2020   CHOL 168 01/10/2019   Lab Results  Component Value Date   HDL 62.00 02/04/2021   HDL 51.90 02/07/2020   HDL 52.30 01/10/2019   Lab Results  Component Value Date   LDLCALC 103 (H) 02/04/2021   LDLCALC 136 (H) 02/07/2020   LDLCALC 107 (H) 01/10/2019   Lab Results  Component Value Date   TRIG 66.0  02/04/2021   TRIG 63.0 02/07/2020   TRIG 44.0 01/10/2019   Lab Results  Component Value Date   CHOLHDL 3 02/04/2021   CHOLHDL 4 02/07/2020   CHOLHDL 3 01/10/2019   No results found for: LDLDIRECT  Improved LDL  Controlled with diet   The 10-year ASCVD risk score (Arnett DK, et al., 2019) is: 7.9%   Values used to calculate the score:     Age: 35 years     Sex: Female     Is Non-Hispanic African American: No     Diabetic: No     Tobacco smoker: No     Systolic Blood Pressure: 629 mmHg     Is BP treated: No     HDL Cholesterol: 62 mg/dL     Total Cholesterol: 178 mg/dL   Other labs Lab Results  Component Value Date   ALT 12 02/04/2021   AST 18 02/04/2021   ALKPHOS 74 02/04/2021   BILITOT 0.5 02/04/2021   Lab Results  Component Value Date   TSH 1.62 02/04/2021   Lab Results  Component Value Date   WBC 5.5 02/04/2021   HGB 13.6 02/04/2021   HCT 41.0 02/04/2021   MCV 89.3 02/04/2021   PLT 232.0 02/04/2021     Review of Systems  Constitutional:  Negative for activity change, appetite change, fatigue,  fever and unexpected weight change.  HENT:  Negative for congestion, ear pain, rhinorrhea, sinus pressure and sore throat.        Feels like something is in R nostril up high  Not painful No congestion or rhinorrhea   Eyes:  Negative for pain, redness and visual disturbance.  Respiratory:  Negative for cough, shortness of breath and wheezing.   Cardiovascular:  Negative for chest pain and palpitations.  Gastrointestinal:  Negative for abdominal pain, blood in stool, constipation and diarrhea.  Endocrine: Negative for polydipsia and polyuria.  Genitourinary:  Negative for dysuria, frequency and urgency.  Musculoskeletal:  Negative for arthralgias, back pain and myalgias.  Skin:  Negative for pallor and rash.  Allergic/Immunologic: Negative for environmental allergies.  Neurological:  Negative for dizziness, syncope and headaches.  Hematological:  Negative for  adenopathy. Does not bruise/bleed easily.  Psychiatric/Behavioral:  Negative for decreased concentration and dysphoric mood. The patient is not nervous/anxious.       Objective:   Physical Exam Constitutional:      General: She is not in acute distress.    Appearance: Normal appearance. She is well-developed and normal weight. She is not ill-appearing or diaphoretic.  HENT:     Head: Normocephalic and atraumatic.     Right Ear: Tympanic membrane, ear canal and external ear normal.     Left Ear: Tympanic membrane, ear canal and external ear normal.     Nose: Nose normal. No congestion.     Comments: R nare is mildly injected No obstruction or mass noted  No sinus tenderness    Mouth/Throat:     Mouth: Mucous membranes are moist.     Pharynx: Oropharynx is clear. No posterior oropharyngeal erythema.  Eyes:     General: No scleral icterus.    Extraocular Movements: Extraocular movements intact.     Conjunctiva/sclera: Conjunctivae normal.     Pupils: Pupils are equal, round, and reactive to light.  Neck:     Thyroid: No thyromegaly.     Vascular: No carotid bruit or JVD.  Cardiovascular:     Rate and Rhythm: Normal rate and regular rhythm.     Pulses: Normal pulses.     Heart sounds: Normal heart sounds.    No gallop.  Pulmonary:     Effort: Pulmonary effort is normal. No respiratory distress.     Breath sounds: Normal breath sounds. No wheezing.     Comments: Good air exch Chest:     Chest wall: No tenderness.  Abdominal:     General: Bowel sounds are normal. There is no distension or abdominal bruit.     Palpations: Abdomen is soft. There is no mass.     Tenderness: There is no abdominal tenderness.     Hernia: No hernia is present.  Genitourinary:    Comments: Breast exam: No mass, nodules, thickening, tenderness, bulging, retraction, inflamation, nipple discharge or skin changes noted.  No axillary or clavicular LA.     Musculoskeletal:        General: No tenderness.  Normal range of motion.     Cervical back: Normal range of motion and neck supple. No rigidity. No muscular tenderness.     Right lower leg: No edema.     Left lower leg: No edema.     Comments: No kyphosis   Lymphadenopathy:     Cervical: No cervical adenopathy.  Skin:    General: Skin is warm and dry.     Coloration: Skin is not pale.  Findings: No erythema or rash.     Comments: Solar lentigines diffusely Baseline brown nevi of different sizes on trunk (pt sees dermatology)  Neurological:     Mental Status: She is alert. Mental status is at baseline.     Cranial Nerves: No cranial nerve deficit.     Motor: No abnormal muscle tone.     Coordination: Coordination normal.     Gait: Gait normal.     Deep Tendon Reflexes: Reflexes are normal and symmetric. Reflexes normal.  Psychiatric:        Mood and Affect: Mood normal.        Cognition and Memory: Cognition and memory normal.          Assessment & Plan:   Problem List Items Addressed This Visit       Cardiovascular and Mediastinum   Essential hypertension    bp in fair control at this time  BP Readings from Last 1 Encounters:  02/12/21 136/78  No changes needed (no medications, controls with health habits) Most recent labs reviewed  Disc lifstyle change with low sodium diet and exercise          Other   Routine general medical examination at a health care facility - Primary    Reviewed health habits including diet and exercise and skin cancer prevention Reviewed appropriate screening tests for age  Also reviewed health mt list, fam hx and immunization status , as well as social and family history   See HPI Labs reviewed  utd imms incl covid booster  dexa is utd (no falls/fx) takes vit D Great exercise and diet, cholesterol improved Mammogram ordered-pt to schedule  Colonoscopy utd        Hyperlipidemia    Disc goals for lipids and reasons to control them Rev last labs with pt Rev low sat fat diet  in detail Better with diet , LDL back into low 100s

## 2021-02-12 NOTE — Assessment & Plan Note (Signed)
bp in fair control at this time  BP Readings from Last 1 Encounters:  02/12/21 136/78   No changes needed (no medications, controls with health habits) Most recent labs reviewed  Disc lifstyle change with low sodium diet and exercise

## 2021-02-13 ENCOUNTER — Telehealth: Payer: Self-pay | Admitting: Family Medicine

## 2021-02-13 ENCOUNTER — Other Ambulatory Visit: Payer: Medicare Other

## 2021-02-13 DIAGNOSIS — R220 Localized swelling, mass and lump, head: Secondary | ICD-10-CM | POA: Insufficient documentation

## 2021-02-13 NOTE — Telephone Encounter (Signed)
Referral to ENT for right nostril discomfort

## 2021-02-20 ENCOUNTER — Encounter: Payer: Medicare Other | Admitting: Family Medicine

## 2021-02-21 ENCOUNTER — Encounter: Payer: Self-pay | Admitting: *Deleted

## 2021-04-10 ENCOUNTER — Ambulatory Visit: Payer: Medicare Other

## 2021-04-10 ENCOUNTER — Ambulatory Visit
Admission: RE | Admit: 2021-04-10 | Discharge: 2021-04-10 | Disposition: A | Discharge status: Home / Self Care | Payer: PRIVATE HEALTH INSURANCE | Source: Ambulatory Visit | Attending: Family Medicine | Admitting: Family Medicine

## 2021-04-10 DIAGNOSIS — Z1231 Encounter for screening mammogram for malignant neoplasm of breast: Secondary | ICD-10-CM | POA: Diagnosis not present

## 2021-04-13 ENCOUNTER — Other Ambulatory Visit: Payer: Self-pay | Admitting: Family Medicine

## 2021-04-13 DIAGNOSIS — R928 Other abnormal and inconclusive findings on diagnostic imaging of breast: Secondary | ICD-10-CM

## 2021-05-08 DIAGNOSIS — Z85828 Personal history of other malignant neoplasm of skin: Secondary | ICD-10-CM | POA: Diagnosis not present

## 2021-05-08 DIAGNOSIS — Z872 Personal history of diseases of the skin and subcutaneous tissue: Secondary | ICD-10-CM | POA: Diagnosis not present

## 2021-05-08 DIAGNOSIS — Z85820 Personal history of malignant melanoma of skin: Secondary | ICD-10-CM | POA: Diagnosis not present

## 2021-05-08 DIAGNOSIS — D2272 Melanocytic nevi of left lower limb, including hip: Secondary | ICD-10-CM | POA: Diagnosis not present

## 2021-05-08 DIAGNOSIS — D2262 Melanocytic nevi of left upper limb, including shoulder: Secondary | ICD-10-CM | POA: Diagnosis not present

## 2021-05-08 DIAGNOSIS — D225 Melanocytic nevi of trunk: Secondary | ICD-10-CM | POA: Diagnosis not present

## 2021-05-11 ENCOUNTER — Ambulatory Visit
Admission: RE | Admit: 2021-05-11 | Discharge: 2021-05-11 | Disposition: A | Discharge status: Home / Self Care | Payer: PPO | Source: Ambulatory Visit | Attending: Family Medicine | Admitting: Family Medicine

## 2021-05-11 DIAGNOSIS — R928 Other abnormal and inconclusive findings on diagnostic imaging of breast: Secondary | ICD-10-CM | POA: Diagnosis not present

## 2021-05-11 DIAGNOSIS — R922 Inconclusive mammogram: Secondary | ICD-10-CM | POA: Diagnosis not present

## 2021-10-01 ENCOUNTER — Encounter: Payer: Self-pay | Admitting: Family Medicine

## 2021-12-07 ENCOUNTER — Encounter: Payer: Self-pay | Admitting: Family Medicine

## 2021-12-13 ENCOUNTER — Encounter: Payer: Self-pay | Admitting: Family Medicine

## 2022-02-08 ENCOUNTER — Telehealth: Payer: Self-pay | Admitting: Family Medicine

## 2022-02-08 DIAGNOSIS — I10 Essential (primary) hypertension: Secondary | ICD-10-CM

## 2022-02-08 DIAGNOSIS — E7800 Pure hypercholesterolemia, unspecified: Secondary | ICD-10-CM

## 2022-02-08 NOTE — Telephone Encounter (Signed)
-----   Message from Velna Hatchet, RT sent at 01/25/2022 12:52 PM EST ----- Regarding: Tue 12/26 lab Patient is scheduled for cpx, please order future labs.  Thanks, Anda Kraft

## 2022-02-09 ENCOUNTER — Other Ambulatory Visit: Payer: PPO

## 2022-02-10 ENCOUNTER — Ambulatory Visit (INDEPENDENT_AMBULATORY_CARE_PROVIDER_SITE_OTHER): Payer: PPO

## 2022-02-10 VITALS — Ht 62.25 in | Wt 136.0 lb

## 2022-02-10 DIAGNOSIS — Z0000 Encounter for general adult medical examination without abnormal findings: Secondary | ICD-10-CM

## 2022-02-10 NOTE — Patient Instructions (Addendum)
Jasmine Oliver , Thank you for taking time to come for your Medicare Wellness Visit. I appreciate your ongoing commitment to your health goals. Please review the following plan we discussed and let me know if I can assist you in the future.   These are the goals we discussed:  Goals: I would like to lose about 10-15lb  Assessment     Notes Portion control        Reduce sugar intake        Stay hydrated     This is a list of the screening recommended for you and due dates:  Health Maintenance  Topic Date Due   COVID-19 Vaccine (7 - 2023-24 season) 02/04/2022   DTaP/Tdap/Td vaccine (3 - Td or Tdap) 03/10/2022   Mammogram  05/12/2022   Medicare Annual Wellness Visit  02/11/2023   Colon Cancer Screening  09/12/2025   Pneumonia Vaccine  Completed   Flu Shot  Completed   DEXA scan (bone density measurement)  Completed   Hepatitis C Screening: USPSTF Recommendation to screen - Ages 37-79 yo.  Completed   Zoster (Shingles) Vaccine  Completed   HPV Vaccine  Aged Out    Advanced directives: End of life planning; Advance aging; Advanced directives discussed.  Copy of current HCPOA/Living Will requested.    Conditions/risks identified: none new  Next appointment: Follow up in one year for your annual wellness visit    Preventive Care 65 Years and Older, Female Preventive care refers to lifestyle choices and visits with your health care provider that can promote health and wellness. What does preventive care include? A yearly physical exam. This is also called an annual well check. Dental exams once or twice a year. Routine eye exams. Ask your health care provider how often you should have your eyes checked. Personal lifestyle choices, including: Daily care of your teeth and gums. Regular physical activity. Eating a healthy diet. Avoiding tobacco and drug use. Limiting alcohol use. Practicing safe sex. Taking low-dose aspirin every day. Taking vitamin and mineral supplements as  recommended by your health care provider. What happens during an annual well check? The services and screenings done by your health care provider during your annual well check will depend on your age, overall health, lifestyle risk factors, and family history of disease. Counseling  Your health care provider may ask you questions about your: Alcohol use. Tobacco use. Drug use. Emotional well-being. Home and relationship well-being. Sexual activity. Eating habits. History of falls. Memory and ability to understand (cognition). Work and work Statistician. Reproductive health. Screening  You may have the following tests or measurements: Height, weight, and BMI. Blood pressure. Lipid and cholesterol levels. These may be checked every 5 years, or more frequently if you are over 86 years old. Skin check. Lung cancer screening. You may have this screening every year starting at age 30 if you have a 30-pack-year history of smoking and currently smoke or have quit within the past 15 years. Fecal occult blood test (FOBT) of the stool. You may have this test every year starting at age 30. Flexible sigmoidoscopy or colonoscopy. You may have a sigmoidoscopy every 5 years or a colonoscopy every 10 years starting at age 33. Hepatitis C blood test. Hepatitis B blood test. Sexually transmitted disease (STD) testing. Diabetes screening. This is done by checking your blood sugar (glucose) after you have not eaten for a while (fasting). You may have this done every 1-3 years. Bone density scan. This is done to screen  for osteoporosis. You may have this done starting at age 36. Mammogram. This may be done every 1-2 years. Talk to your health care provider about how often you should have regular mammograms. Talk with your health care provider about your test results, treatment options, and if necessary, the need for more tests. Vaccines  Your health care provider may recommend certain vaccines, such  as: Influenza vaccine. This is recommended every year. Tetanus, diphtheria, and acellular pertussis (Tdap, Td) vaccine. You may need a Td booster every 10 years. Zoster vaccine. You may need this after age 63. Pneumococcal 13-valent conjugate (PCV13) vaccine. One dose is recommended after age 30. Pneumococcal polysaccharide (PPSV23) vaccine. One dose is recommended after age 36. Talk to your health care provider about which screenings and vaccines you need and how often you need them. This information is not intended to replace advice given to you by your health care provider. Make sure you discuss any questions you have with your health care provider. Document Released: 02/28/2015 Document Revised: 10/22/2015 Document Reviewed: 12/03/2014 Elsevier Interactive Patient Education  2017 [place] Prevention in the Home Falls can cause injuries. They can happen to people of all ages. There are many things you can do to make your home safe and to help prevent falls. What can I do on the outside of my home? Regularly fix the edges of walkways and driveways and fix any cracks. Remove anything that might make you trip as you walk through a door, such as a raised step or threshold. Trim any bushes or trees on the path to your home. Use bright outdoor lighting. Clear any walking paths of anything that might make someone trip, such as rocks or tools. Regularly check to see if handrails are loose or broken. Make sure that both sides of any steps have handrails. Any raised decks and porches should have guardrails on the edges. Have any leaves, snow, or ice cleared regularly. Use sand or salt on walking paths during winter. Clean up any spills in your garage right away. This includes oil or grease spills. What can I do in the bathroom? Use night lights. Install grab bars by the toilet and in the tub and shower. Do not use towel bars as grab bars. Use non-skid mats or decals in the tub or  shower. If you need to sit down in the shower, use a plastic, non-slip stool. Keep the floor dry. Clean up any water that spills on the floor as soon as it happens. Remove soap buildup in the tub or shower regularly. Attach bath mats securely with double-sided non-slip rug tape. Do not have throw rugs and other things on the floor that can make you trip. What can I do in the bedroom? Use night lights. Make sure that you have a light by your bed that is easy to reach. Do not use any sheets or blankets that are too big for your bed. They should not hang down onto the floor. Have a firm chair that has side arms. You can use this for support while you get dressed. Do not have throw rugs and other things on the floor that can make you trip. What can I do in the kitchen? Clean up any spills right away. Avoid walking on wet floors. Keep items that you use a lot in easy-to-reach places. If you need to reach something above you, use a strong step stool that has a grab bar. Keep electrical cords out of the way. Do  not use floor polish or wax that makes floors slippery. If you must use wax, use non-skid floor wax. Do not have throw rugs and other things on the floor that can make you trip. What can I do with my stairs? Do not leave any items on the stairs. Make sure that there are handrails on both sides of the stairs and use them. Fix handrails that are broken or loose. Make sure that handrails are as long as the stairways. Check any carpeting to make sure that it is firmly attached to the stairs. Fix any carpet that is loose or worn. Avoid having throw rugs at the top or bottom of the stairs. If you do have throw rugs, attach them to the floor with carpet tape. Make sure that you have a light switch at the top of the stairs and the bottom of the stairs. If you do not have them, ask someone to add them for you. What else can I do to help prevent falls? Wear shoes that: Do not have high heels. Have  rubber bottoms. Are comfortable and fit you well. Are closed at the toe. Do not wear sandals. If you use a stepladder: Make sure that it is fully opened. Do not climb a closed stepladder. Make sure that both sides of the stepladder are locked into place. Ask someone to hold it for you, if possible. Clearly mark and make sure that you can see: Any grab bars or handrails. First and last steps. Where the edge of each step is. Use tools that help you move around (mobility aids) if they are needed. These include: Canes. Walkers. Scooters. Crutches. Turn on the lights when you go into a dark area. Replace any light bulbs as soon as they burn out. Set up your furniture so you have a clear path. Avoid moving your furniture around. If any of your floors are uneven, fix them. If there are any pets around you, be aware of where they are. Review your medicines with your doctor. Some medicines can make you feel dizzy. This can increase your chance of falling. Ask your doctor what other things that you can do to help prevent falls. This information is not intended to replace advice given to you by your health care provider. Make sure you discuss any questions you have with your health care provider. Document Released: 11/28/2008 Document Revised: 07/10/2015 Document Reviewed: 03/08/2014 Elsevier Interactive Patient Education  2017 [employer].

## 2022-02-10 NOTE — Progress Notes (Signed)
Subjective:   Jasmine Oliver is a 71 y.o. female who presents for Medicare Annual (Subsequent) preventive examination.  Review of Systems    No ROS.  Medicare Wellness Virtual Visit.  Visual/audio telehealth visit, UTA vital signs.   See social history for additional risk factors.   Cardiac Risk Factors include: advanced age (>39mn, >>57women)     Objective:    Today's Vitals   02/10/22 0918  Weight: 136 lb (61.7 kg)  Height: 5' 2.25" (1.581 m)   Body mass index is 24.68 kg/m.     02/10/2022    9:28 AM 02/06/2021    9:00 AM 02/06/2020    8:56 AM 08/04/2015    8:45 AM 07/21/2015    9:59 AM 02/18/2014    8:53 PM  Advanced Directives  Does Patient Have a Medical Advance Directive? Yes Yes No No No No  Type of [place]aramed[place]of [place] will [place] will      Does patient want to make changes to medical advance directive? No - Patient declined Yes (MAU/Ambulatory/Procedural Areas - Information given)      Copy of [place] Chart? No - copy requested       Would patient like information on creating a medical advance directive?   Yes (MAU/Ambulatory/Procedural Areas - Information given) No - patient declined information  No - patient declined information    Current Medications (verified) Outpatient Encounter Medications as of 02/10/2022  Medication Sig   calcium carbonate (OS-CAL) 600 MG TABS tablet Take 1 tablet by mouth daily.   Cholecalciferol (VITAMIN D) 50 MCG (2000 UT) CAPS Take 1 capsule by mouth every other day.   No facility-administered encounter medications on file as of 02/10/2022.    Allergies (verified) Patient has no known allergies.   History: Past Medical History:  Diagnosis Date   Colon polyps    Elevated BP    without hypertension   Nevus    on left retina-- is watched by opthy   Past Surgical History:  Procedure Laterality Date   CESAREAN SECTION     [place]    Family History  Problem Relation Age of Onset   Coronary artery disease Mother    Heart failure Mother    Hypertension Father    Coronary artery disease Father    Cancer Father        smoker- lung cancer    Hypertension Sister    ADD / ADHD Daughter    Alcohol abuse Son        in rehabilitation   ADD / ADHD Son    Colon cancer Neg Hx    Colon polyps Neg Hx    Esophageal cancer Neg Hx    Rectal cancer Neg Hx    Stomach cancer Neg Hx    Social History   Socioeconomic History   Marital status: Married    Spouse name: Not on file   Number of children: 4   Years of education: Not on file   Highest education level: Not on file  Occupational History   Occupation: OSales promotion account executive Tobacco Use   Smoking status: Never   Smokeless tobacco: Never  Substance and Sexual Activity   Alcohol use: Yes    Alcohol/week: 0.0 standard drinks of alcohol    Comment: wine weekly   Drug use: No   Sexual activity: Not on file  Other Topics Concern   Not on file  Social History Narrative   Plays Golf and goes to the gym   Social Determinants of Health   Financial Resource Strain: Low Risk  (02/10/2022)   Overall Financial Resource Strain (CARDIA)    Difficulty of Paying Living Expenses: Not hard at all  Food Insecurity: No Food Insecurity (02/10/2022)   Hunger Vital Sign    Worried About Running Out of Food in the Last Year: Never true    Ran Out of Food in the Last Year: Never true  Transportation Needs: No Transportation Needs (02/10/2022)   PRAPARE - Hydrologist (Medical): No    Lack of Transportation (Non-Medical): No  Physical Activity: Sufficiently Active (02/10/2022)   Exercise Vital Sign    Days of Exercise per Week: 6 days    Minutes of Exercise per Session: 90 min  Stress: No Stress Concern Present (02/10/2022)   [place]     Feeling of Stress : Only a little  Social Connections: Unknown (02/10/2022)   Social Connection and Isolation Panel [NHANES]    Frequency of Communication with Friends and Family: More than three times a week    Frequency of Social Gatherings with Friends and Family: More than three times a week    Attends Religious Services: Not on Advertising copywriter or Organizations: Yes    Attends Music therapist: More than 4 times per year    Marital Status: Married    Tobacco Counseling Counseling given: Not Answered   Clinical Intake:  Pre-visit preparation completed: Yes        Diabetes: No  How often do you need to have someone help you when you read instructions, pamphlets, or other written materials from your doctor or pharmacy?: 1 - Never    Interpreter Needed?: No      Activities of Daily Living    02/10/2022    9:07 AM  In your present state of health, do you have any difficulty performing the following activities:  Hearing? 0  Vision? 1  Difficulty concentrating or making decisions? 0  Walking or climbing stairs? 0  Dressing or bathing? 0  Doing errands, shopping? 0  Preparing Food and eating ? N  Using the Toilet? N  In the past six months, have you accidently leaked urine? Y  Do you have problems with loss of bowel control? N  Managing your Medications? N  Managing your Finances? N  Housekeeping or managing your Housekeeping? N    Patient Care Team: Tower, Wynelle Fanny, MD as PCP - General  Indicate any recent Medical Services you may have received from other than [facility] providers in the past year (date may be approximate).     Assessment:   This is a routine wellness examination for Jasmine Oliver.  I connected with  Jasmine Oliver on 02/10/22 by a audio enabled telemedicine application and verified that I am speaking with the correct person using two identifiers.  Patient Location: Home  Provider Location: Office/Clinic  I discussed the  limitations of evaluation and management by telemedicine. The patient expressed understanding and agreed to proceed.   Hearing/Vision screen Hearing Screening - Comments:: Patient is able to hear conversational tones without difficulty.  No issues reported.   Vision Screening - Comments:: Last exam 12/2020, Dr. Glennon Oliver @ [facility], wears contacts Agrees to schedule  Dietary issues and exercise activities discussed: Current  Exercise Habits: Home exercise routine, Type of exercise: strength training/weights (Golfing x2-3 days), Time (Minutes): 20, Frequency (Times/Week): 2, Weekly Exercise (Minutes/Week): 40, Intensity: Mild  Goals: I would like to lose about 10-15lb  Assessment     Notes Portion control        Reduce sugar intake        Stay hydrated    Depression Screen    02/10/2022    9:21 AM 02/06/2021    9:05 AM 02/06/2020    8:57 AM 01/17/2019    9:29 AM 06/20/2017    8:55 AM  PHQ 2/9 Scores  PHQ - 2 Score 0 0 0 0 1  PHQ- 9 Score   0      Fall Risk    02/10/2022    9:21 AM 02/10/2022    9:07 AM 02/06/2021    9:02 AM 02/06/2020    8:57 AM 01/17/2019    9:29 AM  Fall Risk   Falls in the past year? 0 0 1 0 0  Number falls in past yr: 0 0 0 0   Injury with Fall? 0 0 1 0   Risk for fall due to : No Fall Risks  Other (Comment) No Fall Risks   Risk for fall due to: Comment   tripped on hiking trail    Follow up Falls evaluation completed  Falls prevention discussed Falls evaluation completed;Falls prevention discussed Falls evaluation completed    FALL RISK PREVENTION PERTAINING TO THE HOME: Home free of loose throw rugs in walkways, pet beds, electrical cords, etc? Yes  Adequate lighting in your home to reduce risk of falls? Yes   ASSISTIVE DEVICES UTILIZED TO PREVENT FALLS: Life alert? No  Use of a cane, walker or w/c? No   TIMED UP AND GO: Was the test performed? No .   Cognitive Function:    02/06/2020    9:00 AM  MMSE - Mini Mental State Exam   Orientation to time 5  Orientation to Place 5  Registration 3  Attention/ Calculation 5  Recall 3  Language- repeat 1        02/10/2022    9:29 AM  6CIT Screen  What Year? 0 points  What month? 0 points  What time? 0 points  Count back from 20 0 points  Months in reverse 0 points  Repeat phrase 0 points  Total Score 0 points    Immunizations Immunization History  Administered Date(s) Administered   Fluad Quad(high Dose 65+) 11/17/2018, 12/10/2021   Hepatitis A 07/17/2002   Influenza Split 03/10/2012   Influenza Whole 11/16/2006, 10/16/2009   Influenza,inj,Quad PF,6+ Mos 01/20/2017, 01/17/2018   Influenza-Unspecified 01/22/2015, 12/07/2019, 11/17/2020, 12/10/2021   PFIZER Comirnaty(Gray Top)Covid-19 Tri-Sucrose Vaccine 06/27/2020   PFIZER(Purple Top)SARS-COV-2 Vaccination 04/07/2019, 04/30/2019, 11/09/2019   Pfizer Covid-19 Vaccine Bivalent Booster 54yr & up 11/17/2020, 12/10/2021   Pneumococcal Conjugate-13 01/17/2019   Pneumococcal Polysaccharide-23 02/12/2020   Td 07/17/2002   Tdap 03/10/2012   Zoster Recombinat (Shingrix) 12/07/2018, 02/19/2019   Zoster, Live 05/20/2014   Screening Tests Health Maintenance  Topic Date Due   COVID-19 Vaccine (7 - 2023-24 season) 02/04/2022   DTaP/Tdap/Td (3 - Td or Tdap) 03/10/2022   MAMMOGRAM  05/12/2022   Medicare Annual Wellness (AWV)  02/11/2023   COLONOSCOPY (Pts 45-441yrInsurance coverage will need to be confirmed)  09/12/2025   Pneumonia Vaccine 6551Years old  Completed   INFLUENZA VACCINE  Completed   DEXA SCAN  Completed   Hepatitis C Screening  Completed   Zoster Vaccines- Shingrix  Completed   HPV VACCINES  Aged Out   Health Maintenance Health Maintenance Due  Topic Date Due   COVID-19 Vaccine (7 - 2023-24 season) 02/04/2022   Lung Cancer Screening: (Low Dose CT Chest recommended if Age 17-80 years, 30 pack-year currently smoking OR have quit w/in 15years.) does not qualify.   Vision Screening:  Recommended annual ophthalmology exams for early detection of glaucoma and other disorders of the eye.  Dental Screening: Recommended annual dental exams for proper oral hygiene  Community Resource Referral / Chronic Care Management: CRR required this visit?  No   CCM required this visit?  No      Plan:     I have personally reviewed and noted the following in the patient's chart:   Medical and social history Use of alcohol, tobacco or illicit drugs  Current medications and supplements including opioid prescriptions. Patient is not currently taking opioid prescriptions. Functional ability and status Nutritional status Physical activity Advanced directives List of other physicians Hospitalizations, surgeries, and ER visits in previous 12 months Vitals Screenings to include cognitive, depression, and falls Referrals and appointments  In addition, I have reviewed and discussed with patient certain preventive protocols, quality metrics, and best practice recommendations. A written personalized care plan for preventive services as well as general preventive health recommendations were provided to patient.     Leta Jungling, LPN   [id number]

## 2022-02-16 ENCOUNTER — Encounter: Payer: PPO | Admitting: Family Medicine

## 2022-03-12 DIAGNOSIS — H524 Presbyopia: Secondary | ICD-10-CM | POA: Diagnosis not present

## 2022-03-17 DIAGNOSIS — R69 Illness, unspecified: Secondary | ICD-10-CM | POA: Diagnosis not present

## 2022-03-22 ENCOUNTER — Other Ambulatory Visit (INDEPENDENT_AMBULATORY_CARE_PROVIDER_SITE_OTHER): Payer: Medicare HMO

## 2022-03-22 DIAGNOSIS — E7800 Pure hypercholesterolemia, unspecified: Secondary | ICD-10-CM | POA: Diagnosis not present

## 2022-03-22 DIAGNOSIS — I10 Essential (primary) hypertension: Secondary | ICD-10-CM | POA: Diagnosis not present

## 2022-03-22 LAB — CBC WITH DIFFERENTIAL/PLATELET
Basophils Absolute: 0.1 K/uL (ref 0.0–0.1)
Basophils Relative: 1.2 % (ref 0.0–3.0)
Eosinophils Absolute: 0.1 K/uL (ref 0.0–0.7)
Eosinophils Relative: 2.3 % (ref 0.0–5.0)
HCT: 42.1 % (ref 36.0–46.0)
Hemoglobin: 13.7 g/dL (ref 12.0–15.0)
Lymphocytes Relative: 40.1 % (ref 12.0–46.0)
Lymphs Abs: 2.6 K/uL (ref 0.7–4.0)
MCHC: 32.7 g/dL (ref 30.0–36.0)
MCV: 84.4 fl (ref 78.0–100.0)
Monocytes Absolute: 0.6 K/uL (ref 0.1–1.0)
Monocytes Relative: 8.7 % (ref 3.0–12.0)
Neutro Abs: 3.1 K/uL (ref 1.4–7.7)
Neutrophils Relative %: 47.7 % (ref 43.0–77.0)
Platelets: 286 K/uL (ref 150.0–400.0)
RBC: 4.99 Mil/uL (ref 3.87–5.11)
RDW: 17.1 % — ABNORMAL HIGH (ref 11.5–15.5)
WBC: 6.5 K/uL (ref 4.0–10.5)

## 2022-03-22 LAB — COMPREHENSIVE METABOLIC PANEL
ALT: 13 U/L (ref 0–35)
AST: 20 U/L (ref 0–37)
Albumin: 4.2 g/dL (ref 3.5–5.2)
Alkaline Phosphatase: 79 U/L (ref 39–117)
BUN: 15 mg/dL (ref 6–23)
CO2: 25 mEq/L (ref 19–32)
Calcium: 9.9 mg/dL (ref 8.4–10.5)
Chloride: 105 mEq/L (ref 96–112)
Creatinine, Ser: 0.93 mg/dL (ref 0.40–1.20)
GFR: 62.89 mL/min (ref >60.00)
Glucose, Bld: 91 mg/dL (ref 70–99)
Potassium: 4.4 mEq/L (ref 3.5–5.1)
Sodium: 141 mEq/L (ref 135–145)
Total Bilirubin: 0.3 mg/dL (ref 0.2–1.2)
Total Protein: 6.9 g/dL (ref 6.0–8.3)

## 2022-03-22 LAB — LIPID PANEL
Cholesterol: 180 mg/dL (ref 0–200)
HDL: 67 mg/dL
LDL Cholesterol: 99 mg/dL (ref 0–99)
NonHDL: 112.7
Total CHOL/HDL Ratio: 3
Triglycerides: 71 mg/dL (ref 0.0–149.0)
VLDL: 14.2 mg/dL (ref 0.0–40.0)

## 2022-03-22 LAB — TSH: TSH: 2.19 u[IU]/mL (ref 0.35–5.50)

## 2022-03-29 ENCOUNTER — Ambulatory Visit (INDEPENDENT_AMBULATORY_CARE_PROVIDER_SITE_OTHER): Payer: Medicare HMO | Admitting: Family Medicine

## 2022-03-29 ENCOUNTER — Encounter: Payer: Self-pay | Admitting: Family Medicine

## 2022-03-29 VITALS — BP 122/70 | HR 60 | Temp 97.2°F | Ht 61.75 in | Wt 148.0 lb

## 2022-03-29 DIAGNOSIS — E7800 Pure hypercholesterolemia, unspecified: Secondary | ICD-10-CM | POA: Diagnosis not present

## 2022-03-29 DIAGNOSIS — Z1231 Encounter for screening mammogram for malignant neoplasm of breast: Secondary | ICD-10-CM | POA: Diagnosis not present

## 2022-03-29 DIAGNOSIS — R4589 Other symptoms and signs involving emotional state: Secondary | ICD-10-CM

## 2022-03-29 DIAGNOSIS — Z0000 Encounter for general adult medical examination without abnormal findings: Secondary | ICD-10-CM | POA: Diagnosis not present

## 2022-03-29 DIAGNOSIS — E2839 Other primary ovarian failure: Secondary | ICD-10-CM

## 2022-03-29 DIAGNOSIS — Z1211 Encounter for screening for malignant neoplasm of colon: Secondary | ICD-10-CM

## 2022-03-29 DIAGNOSIS — I10 Essential (primary) hypertension: Secondary | ICD-10-CM

## 2022-03-29 DIAGNOSIS — R69 Illness, unspecified: Secondary | ICD-10-CM | POA: Diagnosis not present

## 2022-03-29 DIAGNOSIS — G4733 Obstructive sleep apnea (adult) (pediatric): Secondary | ICD-10-CM

## 2022-03-29 NOTE — Progress Notes (Signed)
Subjective:    Patient ID: Jasmine Oliver, female    DOB: 06-09-1950, 72 y.o.   MRN: [id number]  HPI  Here for health maintenance exam and to review chronic medical problems    Wt Readings from Last 3 Encounters:  03/29/22 148 lb (67.1 kg)  02/10/22 136 lb (61.7 kg)  02/12/21 136 lb 8 oz (61.9 kg)   27.29 kg/m  Had her 45th wedding Sherry Ruffing to [place] - enjoyed that  Spending time with fam and playing golf   Feels fine   Does a lot of walking and hiking (strength training 2-3 times per week)  Needs to eat better  Going to [place] - enjoys   Would like to get between 130 and 135 lb -feels best with this   Immunization History  Administered Date(s) Administered   Fluad Quad(high Dose 65+) 11/17/2018, 12/10/2021   Hepatitis A 07/17/2002   Influenza Split 03/10/2012   Influenza Whole 11/16/2006, 10/16/2009   Influenza,inj,Quad PF,6+ Mos 01/20/2017, 01/17/2018   Influenza-Unspecified 01/22/2015, 12/07/2019, 11/17/2020, 12/10/2021   PFIZER Comirnaty(Gray Top)Covid-19 Tri-Sucrose Vaccine 06/27/2020   PFIZER(Purple Top)SARS-COV-2 Vaccination 04/07/2019, 04/30/2019, 11/09/2019   Pfizer Covid-19 Vaccine Bivalent Booster 14yr & up 11/17/2020, 12/10/2021   Pneumococcal Conjugate-13 01/17/2019   Pneumococcal Polysaccharide-23 02/12/2020   Td 07/17/2002   Tdap 03/10/2012   Zoster Recombinat (Shingrix) 12/07/2018, 02/19/2019   Zoster, Live 05/20/2014   Health Maintenance Due  Topic Date Due   COVID-19 Vaccine (7 - 2023-24 season) 02/04/2022   DTaP/Tdap/Td (3 - Td or Tdap) 03/10/2022   Tetanus shot - will look into getting at pharmacy   [facility], call back on L was ok  Self breast exam: no lumps    Colonoscopy 08/2020   .Dexa  01/2020  the [facility] Osteopenia  Falls: none  Fractures: none  Supplements  ca and D  Exercise - good/wants to inc strength training   Dermatology care : goes every year  Nothing new  Wears sun protection    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, 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  Takes care of some grandkids   Would like to talk to a counselor about stress and worry   PHQ 8 GAD 7  [place] to learn [place]    HTN bp is stable today  No cp or palpitations or headaches or edema  No side effects to medicines  BP Readings from Last 3 Encounters:  03/29/22 122/70  02/12/21 136/78  09/12/20 119/64    Pulse Readings from Last 3 Encounters:  03/29/22 60  02/12/21 63  09/12/20 (!) 147    No medications currently - working on lifestyle habits   Was borderline OSA- lost weight and it went away   Hyperlipidemia  Lab Results  Component Value Date   CHOL 180 03/22/2022   CHOL 178 02/04/2021   CHOL 201 (H) 02/07/2020   Lab Results  Component Value Date   HDL 67.00 03/22/2022   HDL 62.00 02/04/2021   HDL 51.90 02/07/2020   Lab Results  Component Value Date   LDLCALC 99 03/22/2022   LDLCALC 103 (H) 02/04/2021   LDLCALC 136 (H) 02/07/2020   Lab Results  Component Value Date   TRIG 71.0 03/22/2022   TRIG 66.0 02/04/2021   TRIG 63.0 02/07/2020   Lab Results  Component Value Date   CHOLHDL 3 03/22/2022   CHOLHDL 3 02/04/2021   CHOLHDL 4 02/07/2020   No results found  for: "LDLDIRECT" Tx with diet control  Improved LDL and HDL    Lab Results  Component Value Date   WBC 6.5 03/22/2022   HGB 13.7 03/22/2022   HCT 42.1 03/22/2022   MCV 84.4 03/22/2022   PLT 286.0 03/22/2022   Lab Results  Component Value Date   CREATININE 0.93 03/22/2022   BUN 15 03/22/2022   NA 141 03/22/2022   K 4.4 03/22/2022   CL 105 03/22/2022   CO2 25 03/22/2022   Lab Results  Component Value Date   ALT 13 03/22/2022   AST 20 03/22/2022   ALKPHOS 79 03/22/2022   BILITOT 0.3 03/22/2022   Lab Results  Component Value Date   TSH 2.19 03/22/2022    Patient Active Problem List   Diagnosis Date Noted   Encounter for screening mammogram for breast cancer 03/29/2022    Hyperlipidemia 02/12/2020   Estrogen deficiency 02/12/2020   Welcome to Medicare preventive visit 01/17/2019   Tinnitus 06/22/2017   Colon cancer screening 05/20/2015   Skin cancer screening 05/20/2015   Insomnia 10/12/2013   Snoring 08/29/2013   Dyspareunia 08/29/2013   History of colon polyps 04/13/2011   Routine general medical examination at a health care facility 12/17/2010   Essential hypertension 10/27/2009   STRESS REACTION, ACUTE, WITH EMOTIONAL DISTURBANCE 06/06/2007   Past Medical History:  Diagnosis Date   Colon polyps    Elevated BP    without hypertension   Nevus    on left retina-- is watched by opthy   Past Surgical History:  Procedure Laterality Date   CESAREAN SECTION     COLONOSCOPY  2005   in [place] History   Tobacco Use   Smoking status: Never   Smokeless tobacco: Never  Substance Use Topics   Alcohol use: Yes    Alcohol/week: 0.0 standard drinks of alcohol    Comment: wine weekly   Drug use: No   Family History  Problem Relation Age of Onset   Coronary artery disease Mother    Heart failure Mother    Hypertension Father    Coronary artery disease Father    Cancer Father        smoker- lung cancer    Hypertension Sister    ADD / ADHD Daughter    Alcohol abuse Son        in rehabilitation   ADD / ADHD Son    Colon cancer Neg Hx    Colon polyps Neg Hx    Esophageal cancer Neg Hx    Rectal cancer Neg Hx    Stomach cancer Neg Hx    No Known Allergies Current Outpatient Medications on File Prior to Visit  Medication Sig Dispense Refill   calcium carbonate (OS-CAL) 600 MG TABS tablet Take 1 tablet by mouth daily.     Cholecalciferol (VITAMIN D) 50 MCG (2000 UT) CAPS Take 1 capsule by mouth every other day.     No current facility-administered medications on file prior to visit.     Review of Systems  Constitutional:  Negative for activity change, appetite change, fatigue, fever and unexpected  weight change.  HENT:  Negative for congestion, ear pain, rhinorrhea, sinus pressure and sore throat.   Eyes:  Negative for pain, redness and visual disturbance.  Respiratory:  Negative for cough, shortness of breath and wheezing.   Cardiovascular:  Negative for chest pain and palpitations.  Gastrointestinal:  Negative for abdominal pain, blood in  stool, constipation and diarrhea.  Endocrine: Negative for polydipsia and polyuria.  Genitourinary:  Negative for dysuria, frequency and urgency.  Musculoskeletal:  Negative for arthralgias, back pain and myalgias.  Skin:  Negative for pallor and rash.  Allergic/Immunologic: Negative for environmental allergies.  Neurological:  Negative for dizziness, syncope and headaches.  Hematological:  Negative for adenopathy. Does not bruise/bleed easily.  Psychiatric/Behavioral:  Positive for dysphoric mood. Negative for decreased concentration. The patient is nervous/anxious.        Objective:   Physical Exam Constitutional:      General: She is not in acute distress.    Appearance: Normal appearance. She is well-developed and normal weight. She is not ill-appearing or diaphoretic.  HENT:     Head: Normocephalic and atraumatic.     Right Ear: Tympanic membrane, ear canal and external ear normal.     Left Ear: Tympanic membrane, ear canal and external ear normal.     Nose: Nose normal. No congestion.     Mouth/Throat:     Mouth: Mucous membranes are moist.     Pharynx: Oropharynx is clear. No posterior oropharyngeal erythema.  Eyes:     General: No scleral icterus.    Extraocular Movements: Extraocular movements intact.     Conjunctiva/sclera: Conjunctivae normal.     Pupils: Pupils are equal, round, and reactive to light.  Neck:     Thyroid: No thyromegaly.     Vascular: No carotid bruit or JVD.  Cardiovascular:     Rate and Rhythm: Normal rate and regular rhythm.     Pulses: Normal pulses.     Heart sounds: Normal heart sounds.     No  gallop.  Pulmonary:     Effort: Pulmonary effort is normal. No respiratory distress.     Breath sounds: Normal breath sounds. No wheezing.     Comments: Good air exch Chest:     Chest wall: No tenderness.  Abdominal:     General: Bowel sounds are normal. There is no distension or abdominal bruit.     Palpations: Abdomen is soft. There is no mass.     Tenderness: There is no abdominal tenderness.     Hernia: No hernia is present.  Genitourinary:    Comments: Breast exam: No mass, nodules, thickening, tenderness, bulging, retraction, inflamation, nipple discharge or skin changes noted.  No axillary or clavicular LA.     Musculoskeletal:        General: No tenderness. Normal range of motion.     Cervical back: Normal range of motion and neck supple. No rigidity. No muscular tenderness.     Right lower leg: No edema.     Left lower leg: No edema.     Comments: No kyphosis   Lymphadenopathy:     Cervical: No cervical adenopathy.  Skin:    General: Skin is warm and dry.     Coloration: Skin is not pale.     Findings: No erythema or rash.     Comments: Solar lentigines diffusely   Neurological:     Mental Status: She is alert. Mental status is at baseline.     Cranial Nerves: No cranial nerve deficit.     Motor: No abnormal muscle tone.     Coordination: Coordination normal.     Gait: Gait normal.     Deep Tendon Reflexes: Reflexes are normal and symmetric. Reflexes normal.  Psychiatric:        Attention and Perception: Attention normal.  Mood and Affect: Mood normal. Mood is not anxious or depressed.        Cognition and Memory: Cognition and memory normal.     Comments: Candidly discusses symptoms and stressors             Assessment & Plan:   Problem List Items Addressed This Visit       Cardiovascular and Mediastinum   Essential hypertension    bp in fair control at this time  BP Readings from Last 1 Encounters:  03/29/22 122/70  No changes needed (no  medications, controls with health habits) Most recent labs reviewed  Disc lifstyle change with low sodium diet and exercise          Respiratory   RESOLVED: Obstructive sleep apnea    This resolved with wt loss        Other   Colon cancer screening    Colonoscopy utd 08/2020       Encounter for screening mammogram for breast cancer    Mammogram due in march Order done She will call to schedule       Relevant Orders   MM 3D SCREEN BREAST BILATERAL   Estrogen deficiency   Relevant Orders   DG Bone Density   Hyperlipidemia    Disc goals for lipids and reasons to control them Rev last labs with pt Rev low sat fat diet in detail Good diet  Improved with LDL 99 and HDL 67 Enc her to keep up the good work      Routine general medical examination at a health care facility - Primary    Reviewed health habits including diet and exercise and skin cancer prevention Reviewed appropriate screening tests for age  Also reviewed health mt list, fam hx and immunization status , as well as social and family history   See HPI Enc to get tetanus shot at pharmacy  Mammogram due in march- ordered, she will schedule Dexa also due , was 01/2020   osteopenia , no falls or fx Enc her to continue ca, D and exercise  Utd derm care/enc sun protection  Ref to counseling for mood / anxiety /stress reaction with PHQ 8 and GAD 7 Has good coping techniques       STRESS REACTION, ACUTE, WITH EMOTIONAL DISTURBANCE    Struggling a bit with worry and anx symptoms in setting of family events and aging  Ref to counseling  Reviewed stressors/ coping techniques/symptoms/ support sources/ tx options and side effects in detail today  Good self care and coping habits Good insight       Relevant Orders   Ambulatory referral to Psychology

## 2022-03-29 NOTE — Assessment & Plan Note (Signed)
Mammogram due in march Order done She will call to schedule

## 2022-03-29 NOTE — Assessment & Plan Note (Signed)
Disc goals for lipids and reasons to control them Rev last labs with pt Rev low sat fat diet in detail Good diet  Improved with LDL 99 and HDL 67 Enc her to keep up the good work

## 2022-03-29 NOTE — Assessment & Plan Note (Signed)
Colonoscopy utd 08/2020

## 2022-03-29 NOTE — Assessment & Plan Note (Signed)
Struggling a bit with worry and anx symptoms in setting of family events and aging  Ref to counseling  Reviewed stressors/ coping techniques/symptoms/ support sources/ tx options and side effects in detail today  Good self care and coping habits Good insight

## 2022-03-29 NOTE — Assessment & Plan Note (Signed)
This resolved with wt loss

## 2022-03-29 NOTE — Assessment & Plan Note (Signed)
bp in fair control at this time  BP Readings from Last 1 Encounters:  03/29/22 122/70   No changes needed (no medications, controls with health habits) Most recent labs reviewed  Disc lifstyle change with low sodium diet and exercise

## 2022-03-29 NOTE — Assessment & Plan Note (Signed)
Reviewed health habits including diet and exercise and skin cancer prevention Reviewed appropriate screening tests for age  Also reviewed health mt list, fam hx and immunization status , as well as social and family history   See HPI Enc to get tetanus shot at pharmacy  Mammogram due in march- ordered, she will schedule Dexa also due , was 01/2020   osteopenia , no falls or fx Enc her to continue ca, D and exercise  Utd derm care/enc sun protection  Ref to counseling for mood / anxiety /stress reaction with PHQ 8 and GAD 7 Has good coping techniques

## 2022-03-29 NOTE — Patient Instructions (Addendum)
Check with the pharmacy about getting a tetanus shot since you are due   Keep exercising  Work on strength as well as stamina   You will be due for mammogram in march  Let us know if you don't get a reminder   Keep using sun protection (the clothing is great)   I placed a referral for mental health counseling  If you don't get a call in 1-2 weeks let us know       Call the breast center to set up your mammogram and DEXA Please call the location of your choice from the menu below to schedule your Mammogram and/or Bone Density appointment.    [facility]                      Phone:  [phone], [place] [address]                                                             Services: Traditional and 3D Mammogram, [place] [facility]                 Phone: [phone] [address], [place] [address]    Service: Bone Density ONLY   *this site does NOT perform mammograms  [place]                        Phone:  [phone] [address], [place] [address]                                            Services:  3D Mammogram and [place] at [facility]   Phone:  [phone]   [address], [place] [address]  Services: 3D Mammogram and Bone Density  [facility] [place] ([facility])  Phone:  [phone]   [address]                        [place], [place] [address]                                              Services:  3D Mammogram and Bone Density  

## 2022-04-05 DIAGNOSIS — H25813 Combined forms of age-related cataract, bilateral: Secondary | ICD-10-CM | POA: Diagnosis not present

## 2022-04-15 DIAGNOSIS — R69 Illness, unspecified: Secondary | ICD-10-CM | POA: Diagnosis not present

## 2022-05-16 DIAGNOSIS — R69 Illness, unspecified: Secondary | ICD-10-CM | POA: Diagnosis not present

## 2022-05-19 DIAGNOSIS — H25813 Combined forms of age-related cataract, bilateral: Secondary | ICD-10-CM | POA: Diagnosis not present

## 2022-05-25 ENCOUNTER — Ambulatory Visit (INDEPENDENT_AMBULATORY_CARE_PROVIDER_SITE_OTHER): Payer: Medicare HMO | Admitting: Clinical

## 2022-05-25 DIAGNOSIS — F411 Generalized anxiety disorder: Secondary | ICD-10-CM

## 2022-05-25 DIAGNOSIS — R69 Illness, unspecified: Secondary | ICD-10-CM | POA: Diagnosis not present
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
## 2022-05-25 NOTE — Progress Notes (Signed)
                Jasmine Morlock, LCSW 

## 2022-06-17 DIAGNOSIS — D2262 Melanocytic nevi of left upper limb, including shoulder: Secondary | ICD-10-CM | POA: Diagnosis not present

## 2022-06-17 DIAGNOSIS — Z872 Personal history of diseases of the skin and subcutaneous tissue: Secondary | ICD-10-CM | POA: Diagnosis not present

## 2022-06-17 DIAGNOSIS — Z85828 Personal history of other malignant neoplasm of skin: Secondary | ICD-10-CM | POA: Diagnosis not present

## 2022-06-17 DIAGNOSIS — D2272 Melanocytic nevi of left lower limb, including hip: Secondary | ICD-10-CM | POA: Diagnosis not present

## 2022-06-17 DIAGNOSIS — D485 Neoplasm of uncertain behavior of skin: Secondary | ICD-10-CM | POA: Diagnosis not present

## 2022-06-17 DIAGNOSIS — D2261 Melanocytic nevi of right upper limb, including shoulder: Secondary | ICD-10-CM | POA: Diagnosis not present

## 2022-06-17 DIAGNOSIS — D0372 Melanoma in situ of left lower limb, including hip: Secondary | ICD-10-CM | POA: Diagnosis not present

## 2022-06-17 DIAGNOSIS — D225 Melanocytic nevi of trunk: Secondary | ICD-10-CM | POA: Diagnosis not present

## 2022-06-17 DIAGNOSIS — Z85820 Personal history of malignant melanoma of skin: Secondary | ICD-10-CM | POA: Diagnosis not present

## 2022-07-15 ENCOUNTER — Ambulatory Visit: Payer: Medicare HMO | Admitting: Clinical

## 2022-07-19 DIAGNOSIS — H25811 Combined forms of age-related cataract, right eye: Secondary | ICD-10-CM | POA: Diagnosis not present

## 2022-07-22 DIAGNOSIS — D0372 Melanoma in situ of left lower limb, including hip: Secondary | ICD-10-CM | POA: Diagnosis not present

## 2022-08-05 DIAGNOSIS — H25812 Combined forms of age-related cataract, left eye: Secondary | ICD-10-CM | POA: Diagnosis not present

## 2022-08-09 DIAGNOSIS — S81012A Laceration without foreign body, left knee, initial encounter: Secondary | ICD-10-CM | POA: Diagnosis not present

## 2022-09-17 ENCOUNTER — Ambulatory Visit
Admission: RE | Admit: 2022-09-17 | Discharge: 2022-09-17 | Disposition: A | Discharge status: Home / Self Care | Payer: Medicare HMO | Source: Ambulatory Visit | Attending: Family Medicine | Admitting: Family Medicine

## 2022-09-17 DIAGNOSIS — E2839 Other primary ovarian failure: Secondary | ICD-10-CM

## 2022-09-17 DIAGNOSIS — E349 Endocrine disorder, unspecified: Secondary | ICD-10-CM | POA: Diagnosis not present

## 2022-09-17 DIAGNOSIS — Z1231 Encounter for screening mammogram for malignant neoplasm of breast: Secondary | ICD-10-CM | POA: Diagnosis not present

## 2022-09-17 DIAGNOSIS — N958 Other specified menopausal and perimenopausal disorders: Secondary | ICD-10-CM | POA: Diagnosis not present

## 2022-09-17 DIAGNOSIS — M8588 Other specified disorders of bone density and structure, other site: Secondary | ICD-10-CM | POA: Diagnosis not present

## 2022-09-17 DIAGNOSIS — Z90722 Acquired absence of ovaries, bilateral: Secondary | ICD-10-CM | POA: Diagnosis not present

## 2022-09-19 ENCOUNTER — Encounter: Payer: Self-pay | Admitting: Family Medicine

## 2022-09-19 DIAGNOSIS — M8580 Other specified disorders of bone density and structure, unspecified site: Secondary | ICD-10-CM | POA: Insufficient documentation

## 2022-09-29 ENCOUNTER — Encounter: Payer: Self-pay | Admitting: Family Medicine

## 2022-11-10 ENCOUNTER — Ambulatory Visit: Payer: Medicare HMO

## 2022-11-10 VITALS — Ht 62.0 in | Wt 160.0 lb

## 2022-11-10 DIAGNOSIS — Z0000 Encounter for general adult medical examination without abnormal findings: Secondary | ICD-10-CM | POA: Diagnosis not present

## 2022-11-10 NOTE — Progress Notes (Signed)
Subjective:   Jasmine Oliver is a 72 y.o. female who presents for Medicare Annual (Subsequent) preventive examination.  Visit Complete: Virtual  I connected with  Jasmine Oliver on 11/10/22 by a audio enabled telemedicine application and verified that I am speaking with the correct person using two identifiers.  Patient Location: Home  Provider Location: Home Office  I discussed the limitations of evaluation and management by telemedicine. The patient expressed understanding and agreed to proceed.  Because this visit was a virtual/telehealth visit, some criteria may be missing or patient reported. Any vitals not documented were not able to be obtained and vitals that have been documented are patient reported.   Patient Medicare AWV questionnaire was completed by the patient on 11/10/22; I have confirmed that all information answered by patient is correct and no changes since this date.       Objective:    Today's Vitals   11/10/22 1011  Weight: 160 lb (72.6 kg)  Height: 5\' 2"  (1.575 m)   Body mass index is 29.26 kg/m.     11/10/2022   10:17 AM 02/10/2022    9:28 AM 02/06/2021    9:00 AM 02/06/2020    8:56 AM 08/04/2015    8:45 AM 07/21/2015    9:59 AM 02/18/2014    8:53 PM  Advanced Directives  Does Patient Have a Medical Advance Directive? Yes Yes Yes No No No No  Type of Estate agent of [place];Living will Healthcare Power of [place];Living will Healthcare Power of [place];Living will      Does patient want to make changes to medical advance directive?  No - Patient declined Yes (MAU/Ambulatory/Procedural Areas - Information given)      Copy of Healthcare Power of Attorney in Chart?  No - copy requested       Would patient like information on creating a medical advance directive?    Yes (MAU/Ambulatory/Procedural Areas - Information given) No - patient declined information  No - patient declined information    Current Medications (verified) Outpatient  Encounter Medications as of 11/10/2022  Medication Sig   calcium carbonate (OS-CAL) 600 MG TABS tablet Take 1 tablet by mouth daily.   Cholecalciferol (VITAMIN D) 50 MCG (2000 UT) CAPS Take 1 capsule by mouth every other day.   No facility-administered encounter medications on file as of 11/10/2022.    Allergies (verified) Patient has no known allergies.   History: Past Medical History:  Diagnosis Date   Colon polyps    Elevated BP    without hypertension   Nevus    on left retina-- is watched by opthy   Past Surgical History:  Procedure Laterality Date   CESAREAN SECTION     COLONOSCOPY  2005   in [place]   TOTAL ABDOMINAL HYSTERECTOMY     Family History  Problem Relation Age of Onset   Coronary artery disease Mother    Heart failure Mother    Hypertension Father    Coronary artery disease Father    Cancer Father        smoker- lung cancer    Hypertension Sister    ADD / ADHD Daughter    Alcohol abuse Son        in rehabilitation   ADD / ADHD Son    Colon cancer Neg Hx    Colon polyps Neg Hx    Esophageal cancer Neg Hx    Rectal cancer Neg Hx    Stomach cancer Neg Hx  Social History   Socioeconomic History   Marital status: Married    Spouse name: Not on file   Number of children: 4   Years of education: Not on file   Highest education level: Not on file  Occupational History   Occupation: Nature conservation officer  Tobacco Use   Smoking status: Never   Smokeless tobacco: Never  Substance and Sexual Activity   Alcohol use: Yes    Alcohol/week: 0.0 standard drinks of alcohol    Comment: wine weekly   Drug use: No   Sexual activity: Not on file  Other Topics Concern   Not on file  Social History Narrative   Plays Golf and goes to the gym   Social Determinants of Health   Financial Resource Strain: Low Risk  (11/10/2022)   Overall Financial Resource Strain (CARDIA)    Difficulty of Paying Living Expenses: Not hard at all  Food Insecurity: No Food  Insecurity (11/10/2022)   Hunger Vital Sign    Worried About Running Out of Food in the Last Year: Never true    Ran Out of Food in the Last Year: Never true  Transportation Needs: No Transportation Needs (11/10/2022)   PRAPARE - Administrator, Civil Service (Medical): No    Lack of Transportation (Non-Medical): No  Physical Activity: Sufficiently Active (11/10/2022)   Exercise Vital Sign    Days of Exercise per Week: 6 days    Minutes of Exercise per Session: 120 min  Stress: Stress Concern Present (11/10/2022)   [employer] of Occupational Health - Occupational Stress Questionnaire    Feeling of Stress : To some extent  Social Connections: Unknown (11/10/2022)   Social Connection and Isolation Panel [NHANES]    Frequency of Communication with Friends and Family: More than three times a week    Frequency of Social Gatherings with Friends and Family: More than three times a week    Attends Religious Services: Not on Marketing executive or Organizations: Yes    Attends Engineer, structural: More than 4 times per year    Marital Status: Married    Tobacco Counseling Counseling given: Not Answered   Clinical Intake:  Pre-visit preparation completed: No  Pain : No/denies pain     BMI - recorded: 29.26 Nutritional Status: BMI 25 -29 Overweight Nutritional Risks: None Diabetes: No  How often do you need to have someone help you when you read instructions, pamphlets, or other written materials from your doctor or pharmacy?: (P) 1 - Never  Interpreter Needed?: No  Comments: lives with husband Information entered by :: B.Kodah Maret,LPN   Activities of Daily Living    11/10/2022    9:36 AM 02/10/2022    9:07 AM  In your present state of health, do you have any difficulty performing the following activities:  Hearing? 0 0  Vision? 0 1  Difficulty concentrating or making decisions? 0 0  Walking or climbing stairs? 0 0  Dressing or bathing?  0 0  Doing errands, shopping? 0 0  Preparing Food and eating ? N N  Using the Toilet? N N  In the past six months, have you accidently leaked urine? Y Y  Do you have problems with loss of bowel control? N N  Managing your Medications? N N  Managing your Finances? N N  Housekeeping or managing your Housekeeping? N N    Patient Care Team: Tower, Audrie Gallus, MD as PCP - General  Indicate  any recent Medical Services you may have received from other than [facility] providers in the past year (date may be approximate).     Assessment:   This is a routine wellness examination for Jasmine Oliver.  Hearing/Vision screen Hearing Screening - Comments:: Pt says her hearing is good Vision Screening - Comments:: Readers only:can see well [facility]   Goals Addressed               This Visit's Progress     I would like to lose about 10-15lb (pt-stated)   Not on track     Portion control Reduce sugar intake Stay hydrated      Patient Stated   On track     02/06/2020, I will continue to do aerobics 3 days a week for about 30 minutes.       Patient Stated   Not on track     Would like to maintain current weight       Depression Screen    11/10/2022   10:15 AM 03/29/2022   10:40 AM 02/10/2022    9:21 AM 02/06/2021    9:05 AM 02/06/2020    8:57 AM 01/17/2019    9:29 AM 06/20/2017    8:55 AM  PHQ 2/9 Scores  PHQ - 2 Score 0 0 0 0 0 0 1  PHQ- 9 Score  2   0      Fall Risk    11/10/2022    9:36 AM 03/29/2022   10:40 AM 02/10/2022    9:21 AM 02/10/2022    9:07 AM 02/06/2021    9:02 AM  Fall Risk   Falls in the past year? 0 0 0 0 1  Number falls in past yr:  0 0 0 0  Injury with Fall?  0 0 0 1  Risk for fall due to : No Fall Risks No Fall Risks No Fall Risks  Other (Comment)  Risk for fall due to: Comment     tripped on hiking trail  Follow up Education provided;Falls prevention discussed Falls evaluation completed Falls evaluation completed  Falls prevention discussed    MEDICARE  RISK AT HOME: Medicare Risk at Home Any stairs in or around the home?: Yes If so, are there any without handrails?: Yes Home free of loose throw rugs in walkways, pet beds, electrical cords, etc?: No Adequate lighting in your home to reduce risk of falls?: Yes Life alert?: No Use of a cane, walker or w/c?: No Grab bars in the bathroom?: No Shower chair or bench in shower?: Yes Elevated toilet seat or a handicapped toilet?: Yes  TIMED UP AND GO:  Was the test performed?  No    Cognitive Function:    02/06/2020    9:00 AM  MMSE - Mini Mental State Exam  Orientation to time 5  Orientation to Place 5  Registration 3  Attention/ Calculation 5  Recall 3  Language- repeat 1        11/10/2022   10:18 AM 02/10/2022    9:29 AM  6CIT Screen  What Year? 0 points 0 points  What month? 0 points 0 points  What time? 0 points 0 points  Count back from 20 0 points 0 points  Months in reverse 0 points 0 points  Repeat phrase 0 points 0 points  Total Score 0 points 0 points    Immunizations Immunization History  Administered Date(s) Administered   Fluad Quad(high Dose 65+) 11/17/2018, 12/10/2021   Hepatitis A  07/17/2002   Influenza Split 03/10/2012   Influenza Whole 11/16/2006, 10/16/2009   Influenza, High Dose Seasonal PF 09/29/2022   Influenza,inj,Quad PF,6+ Mos 01/20/2017, 01/17/2018   Influenza-Unspecified 01/22/2015, 12/07/2019, 11/17/2020, 12/10/2021   PFIZER Comirnaty(Gray Top)Covid-19 Tri-Sucrose Vaccine 06/27/2020   PFIZER(Purple Top)SARS-COV-2 Vaccination 04/07/2019, 04/30/2019, 11/09/2019   Pfizer Covid-19 Vaccine Bivalent Booster 50yrs & up 11/17/2020, 12/10/2021   Pneumococcal Conjugate-13 01/17/2019   Pneumococcal Polysaccharide-23 02/12/2020   Td 07/17/2002   Tdap 03/10/2012, 09/29/2022   Zoster Recombinant(Shingrix) 12/07/2018, 02/19/2019   Zoster, Live 05/20/2014    TDAP status: Up to date  Flu Vaccine status: Up to date  Pneumococcal vaccine status:  Up to date  Covid-19 vaccine status: Completed vaccines  Qualifies for Shingles Vaccine? Yes   Zostavax completed Yes   Shingrix Completed?: Yes  Screening Tests Health Maintenance  Topic Date Due   COVID-19 Vaccine (7 - 2023-24 season) 10/17/2022   MAMMOGRAM  09/17/2023   Medicare Annual Wellness (AWV)  11/10/2023   Colonoscopy  09/12/2025   DTaP/Tdap/Td (4 - Td or Tdap) 09/28/2032   Pneumonia Vaccine 33+ Years old  Completed   INFLUENZA VACCINE  Completed   DEXA SCAN  Completed   Hepatitis C Screening  Completed   Zoster Vaccines- Shingrix  Completed   HPV VACCINES  Aged Out    Health Maintenance  Health Maintenance Due  Topic Date Due   COVID-19 Vaccine (7 - 2023-24 season) 10/17/2022    Colorectal cancer screening: Type of screening: Colonoscopy. Completed yes. Repeat every 5-10 years  Mammogram status: Completed yes. Repeat every year  Bone Density status: Completed yes. Results reflect: Bone density results: OSTEOPENIA. Repeat every 3 years.  Lung Cancer Screening: (Low Dose CT Chest recommended if Age 45-80 years, 20 pack-year currently smoking OR have quit w/in 15years.) does not qualify.   Lung Cancer Screening Referral: no  Additional Screening:  Hepatitis C Screening: does not qualify; Completed yes  Vision Screening: Recommended annual ophthalmology exams for early detection of glaucoma and other disorders of the eye. Is the patient up to date with their annual eye exam?  Yes  Who is the provider or what is the name of the office in which the patient attends annual eye exams? [facility] If pt is not established with a provider, would they like to be referred to a provider to establish care? No .   Dental Screening: Recommended annual dental exams for proper oral hygiene  Diabetic Foot Exam: n/a  Community Resource Referral / Chronic Care Management: CRR required this visit?  No   CCM required this visit?  No    Plan:     I have personally  reviewed and noted the following in the patient's chart:   Medical and social history Use of alcohol, tobacco or illicit drugs  Current medications and supplements including opioid prescriptions. Patient is not currently taking opioid prescriptions. Functional ability and status Nutritional status Physical activity Advanced directives List of other physicians Hospitalizations, surgeries, and ER visits in previous 12 months Vitals Screenings to include cognitive, depression, and falls Referrals and appointments  In addition, I have reviewed and discussed with patient certain preventive protocols, quality metrics, and best practice recommendations. A written personalized care plan for preventive services as well as general preventive health recommendations were provided to patient.     Sue Lush, LPN   6/44/0347   After Visit Summary: (MyChart) Due to this being a telephonic visit, the after visit summary with patients personalized plan was offered  to patient via MyChart   Nurse Notes: The patient states she is doing well and has no concerns or questions at this time.

## 2022-11-10 NOTE — Patient Instructions (Addendum)
Ms. Boullion , Thank you for taking time to come for your Medicare Wellness Visit. I appreciate your ongoing commitment to your health goals. Please review the following plan we discussed and let me know if I can assist you in the future.   Referrals/Orders/Follow-Ups/Clinician Recommendations: none  This is a list of the screening recommended for you and due dates:  Health Maintenance  Topic Date Due   COVID-19 Vaccine (7 - 2023-24 season) 10/17/2022   Mammogram  09/17/2023   Medicare Annual Wellness Visit  11/10/2023   Colon Cancer Screening  09/12/2025   DTaP/Tdap/Td vaccine (4 - Td or Tdap) 09/28/2032   Pneumonia Vaccine  Completed   Flu Shot  Completed   DEXA scan (bone density measurement)  Completed   Hepatitis C Screening  Completed   Zoster (Shingles) Vaccine  Completed   HPV Vaccine  Aged Out    Advanced directives: (Copy Requested) Please bring a copy of your health care power of attorney and living will to the office to be added to your chart at your convenience.  Next Medicare Annual Wellness Visit scheduled for next year: Yes 11/14/23 @ 9am telephone

## 2023-01-18 ENCOUNTER — Encounter: Payer: Self-pay | Admitting: Family Medicine

## 2023-02-03 DIAGNOSIS — D2261 Melanocytic nevi of right upper limb, including shoulder: Secondary | ICD-10-CM | POA: Diagnosis not present

## 2023-02-03 DIAGNOSIS — Z85820 Personal history of malignant melanoma of skin: Secondary | ICD-10-CM | POA: Diagnosis not present

## 2023-02-03 DIAGNOSIS — D2262 Melanocytic nevi of left upper limb, including shoulder: Secondary | ICD-10-CM | POA: Diagnosis not present

## 2023-02-03 DIAGNOSIS — D225 Melanocytic nevi of trunk: Secondary | ICD-10-CM | POA: Diagnosis not present

## 2023-02-03 DIAGNOSIS — Z85828 Personal history of other malignant neoplasm of skin: Secondary | ICD-10-CM | POA: Diagnosis not present

## 2023-02-03 DIAGNOSIS — L821 Other seborrheic keratosis: Secondary | ICD-10-CM | POA: Diagnosis not present

## 2023-02-03 DIAGNOSIS — D485 Neoplasm of uncertain behavior of skin: Secondary | ICD-10-CM | POA: Diagnosis not present

## 2023-02-03 DIAGNOSIS — Z872 Personal history of diseases of the skin and subcutaneous tissue: Secondary | ICD-10-CM | POA: Diagnosis not present

## 2023-02-03 DIAGNOSIS — Z86006 Personal history of melanoma in-situ: Secondary | ICD-10-CM | POA: Diagnosis not present

## 2023-02-03 DIAGNOSIS — D2271 Melanocytic nevi of right lower limb, including hip: Secondary | ICD-10-CM | POA: Diagnosis not present

## 2023-02-03 DIAGNOSIS — D2272 Melanocytic nevi of left lower limb, including hip: Secondary | ICD-10-CM | POA: Diagnosis not present

## 2023-02-26 ENCOUNTER — Encounter (HOSPITAL_COMMUNITY): Payer: Self-pay

## 2023-02-26 ENCOUNTER — Other Ambulatory Visit: Payer: Self-pay

## 2023-02-26 ENCOUNTER — Emergency Department (HOSPITAL_COMMUNITY)
Admission: EM | Admit: 2023-02-26 | Discharge: 2023-02-26 | Disposition: A | Discharge status: Home / Self Care | Payer: Medicare Other | Attending: Emergency Medicine | Admitting: Emergency Medicine

## 2023-02-26 DIAGNOSIS — T68XXXA Hypothermia, initial encounter: Secondary | ICD-10-CM | POA: Diagnosis not present

## 2023-02-26 DIAGNOSIS — X31XXXA Exposure to excessive natural cold, initial encounter: Secondary | ICD-10-CM | POA: Insufficient documentation

## 2023-02-26 DIAGNOSIS — Z743 Need for continuous supervision: Secondary | ICD-10-CM | POA: Diagnosis not present

## 2023-02-26 DIAGNOSIS — F4381 Prolonged grief disorder: Secondary | ICD-10-CM | POA: Insufficient documentation

## 2023-02-26 DIAGNOSIS — R9431 Abnormal electrocardiogram [ECG] [EKG]: Secondary | ICD-10-CM | POA: Diagnosis not present

## 2023-02-26 DIAGNOSIS — R001 Bradycardia, unspecified: Secondary | ICD-10-CM | POA: Diagnosis not present

## 2023-02-26 DIAGNOSIS — T699XXA Effect of reduced temperature, unspecified, initial encounter: Secondary | ICD-10-CM | POA: Diagnosis not present

## 2023-02-26 DIAGNOSIS — R0689 Other abnormalities of breathing: Secondary | ICD-10-CM | POA: Diagnosis not present

## 2023-02-26 DIAGNOSIS — F4320 Adjustment disorder, unspecified: Secondary | ICD-10-CM

## 2023-02-26 MED ORDER — LORAZEPAM 1 MG PO TABS: 1.0000 mg | ORAL_TABLET | Freq: Two times a day (BID) | ORAL | 0 refills | Status: DC | PRN
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
## 2023-02-26 NOTE — Discharge Instructions (Addendum)
 We are prescribing of Ativan to help with the nerves, as we anticipated difficult grieving period.  Please call your PCP and set up an appointment with them if needed.
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
## 2023-02-26 NOTE — Progress Notes (Signed)
   02/26/23 1418  Spiritual Encounters  Type of Visit Initial  Care provided to: Pt and family  Referral source Chaplain assessment  Reason for visit Grief/loss  OnCall Visit Yes  Spiritual Framework  Presenting Themes Values and beliefs;Significant life change;Impactful experiences and emotions  Values/beliefs patient believes in the [place] god. Patient values caring for others.  Community/Connection Family;Significant other  Patient Stress Factors Loss;Major life changes  Family Stress Factors Loss;Major life changes  Interventions  Spiritual Care Interventions Made Established relationship of care and support;Compassionate presence;Reflective listening;Normalization of emotions;Explored values/beliefs/practices/strengths;Bereavement/grief support;Supported grief process;Encouragement;Self-care teaching  Intervention Outcomes  Outcomes Reduced anxiety;Reduced fear;Connection to spiritual care;Awareness of support  Spiritual Care Plan  Spiritual Care Issues Still Outstanding No further spiritual care needs at this time (see row info)   Chaplain provided care for the patient as she had just lost her grandson. Chaplain provided support for the patient as well as her husband. Chaplain remained with the patient and her family until she was discharged. Chaplain assisted the patient and her husband to the ED waiting area. Chaplain provided care for the patient and her husband until they were able to leave. Chaplain remained with the patient's family that were still present in the ED.   Alan Lesches, Chaplain Resident [phone]

## 2023-03-03 ENCOUNTER — Encounter: Payer: Self-pay | Admitting: Family Medicine

## 2023-03-03 DIAGNOSIS — F418 Other specified anxiety disorders: Secondary | ICD-10-CM

## 2023-03-03 MED ORDER — LORAZEPAM 1 MG PO TABS
1.0000 mg | ORAL_TABLET | Freq: Two times a day (BID) | ORAL | 0 refills | Status: DC | PRN
Start: 2023-03-03 — End: 2023-03-22

## 2023-03-03 NOTE — Telephone Encounter (Signed)
Message routed to me in Dr. Royden Purl absence.  Rx for lorazepam sent to pharmacy.

## 2023-03-03 NOTE — Telephone Encounter (Signed)
Thank you so much!! I have been trying and trying to reach her  Awful situation  You are wonderful

## 2023-03-04 NOTE — Telephone Encounter (Signed)
Left VM requesting pt to call the office back 

## 2023-03-04 NOTE — Telephone Encounter (Signed)
I've left several messages  Please try to reach her again later today - if she has a better time to reach her let her know/ just wanted to see how she is doing  If she would like to come in for an appointment that would be great  I'm sure she is very busy with family and arrangements right now  Thanks

## 2023-03-08 ENCOUNTER — Telehealth: Payer: Self-pay | Admitting: *Deleted

## 2023-03-08 NOTE — Telephone Encounter (Signed)
Scheduled ER f/u and sent mychart message with appt info

## 2023-03-10 ENCOUNTER — Ambulatory Visit: Payer: Medicare Other | Admitting: Family Medicine

## 2023-03-10 ENCOUNTER — Encounter: Payer: Self-pay | Admitting: Family Medicine

## 2023-03-10 VITALS — BP 138/80 | HR 68 | Temp 98.2°F | Ht 62.0 in

## 2023-03-10 DIAGNOSIS — F4321 Adjustment disorder with depressed mood: Secondary | ICD-10-CM

## 2023-03-10 DIAGNOSIS — Z87828 Personal history of other (healed) physical injury and trauma: Secondary | ICD-10-CM | POA: Diagnosis not present

## 2023-03-10 DIAGNOSIS — I10 Essential (primary) hypertension: Secondary | ICD-10-CM | POA: Diagnosis not present

## 2023-03-10 DIAGNOSIS — R4589 Other symptoms and signs involving emotional state: Secondary | ICD-10-CM | POA: Insufficient documentation

## 2023-03-10 MED ORDER — SERTRALINE HCL 25 MG PO TABS: 25.0000 mg | ORAL_TABLET | Freq: Every day | ORAL | 1 refills | Status: DC
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
## 2023-03-10 NOTE — Assessment & Plan Note (Signed)
Blood pressure up today s/p trauma with anxious mood  BP: 138/80  Encouraged self care Will continue to watch  No medicines currently
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
## 2023-03-10 NOTE — Patient Instructions (Addendum)
Start generic zoloft 25 mg each evening  If any intolerable side effects or you feel more depressed or anxious-stop and let us know  Not unusual to feel disconnected or foggy for the first week   After a few weeks- let us know how you are and we can decide whether to increase the dose   Let us know when/if you need more lorazepam as well   Keep your appointment for March  If you need to come earlier that is fine   I will work on counseling referral  Please take care of yourself    If anything worsens-please let me know

## 2023-03-22 ENCOUNTER — Encounter: Payer: Self-pay | Admitting: Family Medicine

## 2023-03-22 DIAGNOSIS — F418 Other specified anxiety disorders: Secondary | ICD-10-CM

## 2023-03-22 MED ORDER — LORAZEPAM 1 MG PO TABS
1.0000 mg | ORAL_TABLET | Freq: Two times a day (BID) | ORAL | 0 refills | Status: DC | PRN
Start: 2023-03-22 — End: 2023-11-16

## 2023-03-22 NOTE — Telephone Encounter (Signed)
Name of Medication: Ativan Name of Pharmacy: [facility] Last Fill or Written Date and Quantity: 03/03/23 #10 tabs/ 0 refills Last Office Visit and Type: 03/10/23 ER f/u Next Office Visit and Type: 05/11/23 CPE

## 2023-03-23 ENCOUNTER — Ambulatory Visit: Payer: Medicare Other | Admitting: Licensed Clinical Social Worker

## 2023-03-23 ENCOUNTER — Telehealth: Payer: Self-pay | Admitting: Family Medicine

## 2023-03-23 DIAGNOSIS — F4310 Post-traumatic stress disorder, unspecified: Secondary | ICD-10-CM | POA: Diagnosis not present

## 2023-03-23 DIAGNOSIS — I10 Essential (primary) hypertension: Secondary | ICD-10-CM

## 2023-03-23 DIAGNOSIS — E7800 Pure hypercholesterolemia, unspecified: Secondary | ICD-10-CM
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
## 2023-03-23 NOTE — Telephone Encounter (Signed)
-----   Message from Bernadene Brewer sent at 03/10/2023  9:11 AM EST ----- Regarding: Lab Fri 03/25/23 Hello,  Patient is coming in for CPE labs on Fri 03/25/23. Can we get orders please.   Thanks

## 2023-03-25 ENCOUNTER — Other Ambulatory Visit (INDEPENDENT_AMBULATORY_CARE_PROVIDER_SITE_OTHER): Payer: Medicare Other

## 2023-03-25 DIAGNOSIS — I10 Essential (primary) hypertension: Secondary | ICD-10-CM | POA: Diagnosis not present

## 2023-03-25 DIAGNOSIS — E7800 Pure hypercholesterolemia, unspecified: Secondary | ICD-10-CM | POA: Diagnosis not present

## 2023-03-25 LAB — TSH: TSH: 1.28 u[IU]/mL (ref 0.35–5.50)

## 2023-03-25 LAB — CBC WITH DIFFERENTIAL/PLATELET
Basophils Absolute: 0 10*3/uL (ref 0.0–0.1)
Basophils Relative: 0.8 % (ref 0.0–3.0)
Eosinophils Absolute: 0.1 10*3/uL (ref 0.0–0.7)
Eosinophils Relative: 2.2 % (ref 0.0–5.0)
HCT: 42.3 % (ref 36.0–46.0)
Hemoglobin: 13.9 g/dL (ref 12.0–15.0)
Lymphocytes Relative: 33 % (ref 12.0–46.0)
Lymphs Abs: 1.6 10*3/uL (ref 0.7–4.0)
MCHC: 32.9 g/dL (ref 30.0–36.0)
MCV: 89.4 fL (ref 78.0–100.0)
Monocytes Absolute: 0.3 10*3/uL (ref 0.1–1.0)
Monocytes Relative: 7.2 % (ref 3.0–12.0)
Neutro Abs: 2.7 10*3/uL (ref 1.4–7.7)
Neutrophils Relative %: 56.8 % (ref 43.0–77.0)
Platelets: 239 10*3/uL (ref 150.0–400.0)
RBC: 4.73 Mil/uL (ref 3.87–5.11)
RDW: 15.4 % (ref 11.5–15.5)
WBC: 4.8 10*3/uL (ref 4.0–10.5)

## 2023-03-25 LAB — COMPREHENSIVE METABOLIC PANEL
ALT: 9 U/L (ref 0–35)
AST: 17 U/L (ref 0–37)
Albumin: 3.9 g/dL (ref 3.5–5.2)
Alkaline Phosphatase: 63 U/L (ref 39–117)
BUN: 18 mg/dL (ref 6–23)
CO2: 28 meq/L (ref 19–32)
Calcium: 9.3 mg/dL (ref 8.4–10.5)
Chloride: 108 meq/L (ref 96–112)
Creatinine, Ser: 0.85 mg/dL (ref 0.40–1.20)
GFR: 69.57 mL/min (ref >60.00)
Glucose, Bld: 84 mg/dL (ref 70–99)
Potassium: 4.1 meq/L (ref 3.5–5.1)
Sodium: 146 meq/L — ABNORMAL HIGH (ref 135–145)
Total Bilirubin: 0.5 mg/dL (ref 0.2–1.2)
Total Protein: 6.5 g/dL (ref 6.0–8.3)

## 2023-03-25 LAB — LIPID PANEL
Cholesterol: 170 mg/dL (ref 0–200)
HDL: 61.1 mg/dL (ref >39.00)
LDL Cholesterol: 101 mg/dL — ABNORMAL HIGH (ref 0–99)
NonHDL: 109.31
Total CHOL/HDL Ratio: 3
Triglycerides: 43 mg/dL (ref 0.0–149.0)
VLDL: 8.6 mg/dL (ref 0.0–40.0)

## 2023-03-30 ENCOUNTER — Ambulatory Visit: Payer: Medicare Other | Admitting: Licensed Clinical Social Worker

## 2023-03-30 DIAGNOSIS — F4310 Post-traumatic stress disorder, unspecified: Secondary | ICD-10-CM | POA: Diagnosis not present

## 2023-03-31 NOTE — Progress Notes (Signed)
[facility] Counselor/Therapist Progress Note  Patient ID: Jasmine Oliver, MRN: [id number]    Date: 03/30/2023  Time Spent: 100  pm - 0200 pm : 60 Minutes  Treatment Type: Individual Therapy.  Reported Symptoms: Trauma, grief, depression, poor sleep, difficulty with concentration.   Mental Status Exam: Appearance:  Neat     Behavior: Appropriate  Motor: Normal  Speech/Language:  Clear and Coherent  Affect: Flat  Mood: depressed  Thought process: circumstantial  Thought content:   WNL  Sensory/Perceptual disturbances:   WNL  Orientation: oriented to person, place, time/date, situation, day of week, month of year, and year  Attention: Fair  Concentration: Fair  Memory: WNL  Fund of knowledge:  Good  Insight:   Good  Judgment:  Good  Impulse Control: Good    Risk Assessment: Danger to Self:  No Self-injurious Behavior: No Danger to Others: No Duty to Warn:no Physical Aggression / Violence:No  Access to Firearms a concern: No  Gang Involvement:No    Subjective:    Jasmine Oliver participated from office, located at [employer] with Clinician present. Jasmine Oliver consented to treatment.  Jasmine Oliver presented for her session visibly crying and upset. Patient reports she continues to have intrusive thoughts and flashbacks of her grandson dying. Patient reports that she feels as though she relives the event multiple times daily. She states that she has avoided walking near and playing golf where the incident occurred. Patient reports difficulty with concentration, guilt, shame and negative thoughts about herself.  Patient reports that she feels each day is a struggle and when the thoughts and feelings arise she has difficulty holding back her tears.  Clinician provided support via active listening and assuring patient that she is in a safe space to discuss her feelings without judgement. Clinician identified the meaning of cognitive restructuring to challenge negative thoughts  and discussed coping skills to assist in managing symptoms. Clinician provided validation of patients feelings and comfort.  Patient was receptive to feedback but still very emotional and difficult to console. Patient reports an interest in EMDR Therapy as she is hoping for some relief from her emotional state at this time. Clinician agreed to reach out to a colleague to see if they have availability. Patient agreed to attempt to utilize currently suggested coping skills.   Coping skills:   Patient will allow herself to feel her emotions, seek support from loved ones, maintaining a healthy routine with good sleep and nutrition, engage in activities you enjoy, expressing your grief through creative outlets, setting aside time to grieve, and accepting that grief is a process that takes time; it's important to be patient with herself and not try to suppress her feelings.   Treatment Plan Goals     Strengths: Patient has good insight and is very close to her children and grandchildren.  Supports: Patient has a supportive husband and family.    Goal/Needs for Treatment:  In order of importance to patient 1) "I need to learn how to cope with my grief." 2) "I need to learn how to cope with my guilt."      Client Statement of Needs: Patient reports that she needs to learn how to cope with her grief and guilt related to death of her grandchild.     Treatment Level: Moderate-Weekly  Symptoms: Poor sleep, loss of interest, poor concentration, guilt and trauma  Client Treatment Preferences:Cognitive Behavioral Health, Acceptance and Commitment Therapy (ACT): Acceptance and Commitment , Interpersonal Therapy    Healthcare consumer's goal  for treatment:   Therapist Phyllis Ginger MSW, LCSW will support the patient's ability to achieve the goals identified. Cognitive Behavioral Therapy, Assertive Communication, Relaxation Training, ACT, Humanistic and other evidenced-based practices will be used to  promote progress towards healthy functioning.    Healthcare consumer will: Actively participate in therapy, working towards healthy functioning.     *Justification for Continuation/Discontinuation of Goal: R=Revised, O=Ongoing, A=Achieved, D=Discontinued   Goal 1)  Baseline date 03/23/2023: Progress towards goal  O      Patient and Clinician will utilize,"The model of grief developed by [facility] psychology professor Leanna Sato" that  lists four tasks of mourning that encompass these shared long-term goals:   1.Accept the reality of a loss 2.Work through the emotional pain of grief 3.Adjust to life without the person who has died 4.Retain a connection with that person  while building a new life   Therpaist and Patient will also utilize  [employer] five stages of grief to describe long-term goals as moving from denial to acceptance, while also using the dual process model while processing emotions and adjusting to secondary losses as goals. These point to the same themes of recovery as Worden's four tasks. Ultimately, the goal of grief treatment is not to get over grief or stop feeling the ache of loss, but to integrate that ache into a new vision of life.; How Often - Daily Target Date-03/22/2024   Goal 2)  Baseline date 03/23/2023: Progress towards goal O  Understanding the source of the guilt, developing self-compassion, learning to forgive oneself, identifying negative thought patterns, practicing mindfulness, and creating healthy coping mechanisms to manage the emotion, can allow patient  to move forward without the burden of excessive guilt.    Identify the source of guilt: Explore past experiences and situations that contribute to current feelings of guilt to gain clarity about the root cause.  Challenge negative self-talk: Recognize and challenge self-critical thoughts that perpetuate feelings of guilt, replacing them with more balanced perspectives.  Develop  self-compassion: Practice self-kindness and acceptance, recognizing that everyone makes mistakes and deserves to be treated with compassion.  Learn forgiveness techniques: Work towards forgiving oneself and others for past actions, releasing the emotional weight of guilt.  Mindfulness practice: Utilize mindfulness exercises to become more aware of guilt-related thoughts and feelings in the present moment, allowing for non-judgmental observation.  Establish healthy boundaries: Learn to set appropriate boundaries in relationships to prevent unnecessary guilt from taking on too much responsibility.  Develop coping mechanisms: Acquire healthy strategies to manage guilt, such as relaxation techniques, journaling, or physical activity.  Explore the impact of guilt on relationships: Examine how guilt affects interactions with others and learn to communicate effectively about feelings.  Address underlying trauma: If guilt stems from past traumatic experiences, therapy can help process and heal from trauma to reduce the associated guilt. How Often - Daily Target Date: 03/22/2024 This plan has been reviewed and created by the following participants:  This plan will be reviewed at least every 12 months.    Interventions: Cognitive Behavioral Therapy and Grief Therapy  Diagnosis: Post Traumatic Stress Disorder   Phyllis Ginger MSW, LCSW/DATE 03/30/2023

## 2023-04-02 ENCOUNTER — Other Ambulatory Visit: Payer: Self-pay | Admitting: Family Medicine

## 2023-04-06 ENCOUNTER — Ambulatory Visit (INDEPENDENT_AMBULATORY_CARE_PROVIDER_SITE_OTHER): Payer: Medicare Other | Admitting: Licensed Clinical Social Worker

## 2023-04-06 DIAGNOSIS — F4310 Post-traumatic stress disorder, unspecified: Secondary | ICD-10-CM | POA: Diagnosis not present

## 2023-04-06 NOTE — Progress Notes (Unsigned)
[facility] Counselor/Therapist Progress Note  Patient ID: Jasmine Oliver, MRN: [id number]    Date: 04/06/23  Time Spent: 0300  pm - 0358 pm : 58 Minutes  Treatment Type: Individual Therapy.  Reported Symptoms: Trauma, grief, depression, poor sleep, difficulty with concentration.   Mental Status Exam: Appearance:  Neat     Behavior: Appropriate  Motor: Normal  Speech/Language:  Clear and Coherent  Affect: Flat  Mood: depressed  Thought process: circumstantial  Thought content:   WNL  Sensory/Perceptual disturbances:   WNL  Orientation: oriented to person, place, time/date, situation, day of week, month of year, and year  Attention: Fair  Concentration: Fair  Memory: WNL  Fund of knowledge:  Good  Insight:   Good  Judgment:  Good  Impulse Control: Good    Risk Assessment: Danger to Self:  No Self-injurious Behavior: No Danger to Others: No Duty to Warn:no Physical Aggression / Violence:No  Access to Firearms a concern: No  Gang Involvement:No    Subjective:    Marisa Hua participated from home, via video. Morayma is aware of  risks and limitations, and consented to treatment. Therapist participated from home office. We met online due to inclimate weather.  Aliene presented for her session in a more positive mind frame. Patient discussed the weather and then discussed the upcoming trip she and her spouse are taking to [place]. Patient was able to talk more this session about the incident with her grandson and was able to do so without being as tearful as in previous sessions. Patient reports that she had a good weekend previously. She spent time with two of her daughter and her grandchildren. She reports having a good conversation with her son in law and daughter about some of the details of the her grandson passed. Patient acknowledged that it was helpful to talk with them and it provided a bit of comfort and confirmation for her daughter as she had many questions due  to having an idea of they way things transpired versus what really happened.   Clinician provided support and comfort via active listening and verbal interaction with patient. Clinician reiterated the benefits of talking about her feelings as well as the event in order to get to a place of acceptance and peace. Clinician reminded patient when she mentioned that his parents want Ayden's bedroom cleaned out and the door left open so they can get used to walking by it without his things in the room; that how one processed their grief is very personal. There are no time lines nor are there right or wrong ways to approach handling these things.  Patient exhibited a more calm demeanor and appeared more at peace. Patient had her own idea of how to look at the situation. Patient reports that looking back she can see things that could have been done differently, but she can't go back. She states that she has came to the conclusion that due to her age she cannot do many of the things she was previously able to do. She identified that she is attempting to see what positive things may come from this tragedy due to the changes that can be made by drawing closer as a family and living each day to it's fullest. Clinician praised patient for her positive insight and her growth as she works to process her emotions. Clinician reported that she has reached out to the team to discuss EMDR for patient but did not receive confirmation that anyone has a spot.  Clinician agreed to contact them again and let patient know by Monday. Patient will continue to participate in weekly therapy. Treatment plan to be updated by 03/23/2023.  Coping skills:    Patient will allow herself to feel her emotions, seek support from loved ones, maintaining a healthy routine with good sleep and nutrition, engage in activities you enjoy, expressing your grief through creative outlets, setting aside time to grieve, and accepting that grief is a process that  takes time; it's important to be patient with herself and not try to suppress her feelings.    Treatment Plan Goals     Strengths: Patient has good insight and is very close to her children and grandchildren.  Supports: Patient has a supportive husband and family.    Goal/Needs for Treatment:  In order of importance to patient 1) "I need to learn how to cope with my grief." 2) "I need to learn how to cope with my guilt."      Client Statement of Needs: Patient reports that she needs to learn how to cope with her grief and guilt related to death of her grandchild.     Treatment Level: Moderate-Weekly  Symptoms: Poor sleep, loss of interest, poor concentration, guilt and trauma  Client Treatment Preferences:Cognitive Behavioral Health, Acceptance and Commitment Therapy (ACT): Acceptance and Commitment , Interpersonal Therapy    Healthcare consumer's goal for treatment:   Therapist Phyllis Ginger MSW, LCSW will support the patient's ability to achieve the goals identified. Cognitive Behavioral Therapy, Assertive Communication, Relaxation Training, ACT, Humanistic and other evidenced-based practices will be used to promote progress towards healthy functioning.    Healthcare consumer will: Actively participate in therapy, working towards healthy functioning.     *Justification for Continuation/Discontinuation of Goal: R=Revised, O=Ongoing, A=Achieved, D=Discontinued   Goal 1)  Baseline date 03/23/2023: Progress towards goal  O      Patient and Clinician will utilize,"The model of grief developed by [facility] psychology professor Leanna Sato" that  lists four tasks of mourning that encompass these shared long-term goals:   1.Accept the reality of a loss 2.Work through the emotional pain of grief 3.Adjust to life without the person who has died 4.Retain a connection with that person  while building a new life   Therpaist and Patient will also utilize  [employer] five  stages of grief to describe long-term goals as moving from denial to acceptance, while also using the dual process model while processing emotions and adjusting to secondary losses as goals. These point to the same themes of recovery as Worden's four tasks. Ultimately, the goal of grief treatment is not to get over grief or stop feeling the ache of loss, but to integrate that ache into a new vision of life.; How Often - Daily Target Date-03/22/2024   Goal 2)  Baseline date 03/23/2023: Progress towards goal O  Understanding the source of the guilt, developing self-compassion, learning to forgive oneself, identifying negative thought patterns, practicing mindfulness, and creating healthy coping mechanisms to manage the emotion, can allow patient  to move forward without the burden of excessive guilt.    Identify the source of guilt: Explore past experiences and situations that contribute to current feelings of guilt to gain clarity about the root cause.  Challenge negative self-talk: Recognize and challenge self-critical thoughts that perpetuate feelings of guilt, replacing them with more balanced perspectives.  Develop self-compassion: Practice self-kindness and acceptance, recognizing that everyone makes mistakes and deserves to be treated with compassion.  Learn forgiveness  techniques: Work towards forgiving oneself and others for past actions, releasing the emotional weight of guilt.  Mindfulness practice: Utilize mindfulness exercises to become more aware of guilt-related thoughts and feelings in the present moment, allowing for non-judgmental observation.  Establish healthy boundaries: Learn to set appropriate boundaries in relationships to prevent unnecessary guilt from taking on too much responsibility.  Develop coping mechanisms: Acquire healthy strategies to manage guilt, such as relaxation techniques, journaling, or physical activity.  Explore the impact of guilt on relationships: Examine  how guilt affects interactions with others and learn to communicate effectively about feelings.  Address underlying trauma: If guilt stems from past traumatic experiences, therapy can help process and heal from trauma to reduce the associated guilt. How Often - Daily Target Date: 03/22/2024 This plan has been reviewed and created by the following participants:  This plan will be reviewed at least every 12 months.     Interventions: Cognitive Behavioral Therapy and Grief Therapy   Diagnosis: Post Traumatic Stress Disorder    Phyllis Ginger MSW, LCSW/DATE 04/06/2023

## 2023-05-03 NOTE — Progress Notes (Unsigned)
   Dariah Mcsorley T. Constantino Starace, MD, CAQ Sports Medicine [facility] at [facility] [address] [place], [address]  Phone: [phone]  FAX: [phone]  Jasmine Oliver - 73 y.o. female  MRN [id number]  Date of Birth: 07/04/1950  Date: 05/04/2023  PCP: Judy Pimple, MD  Referral: Judy Pimple, MD  No chief complaint on file.  Subjective:   Jasmine Oliver is a 73 y.o. very pleasant female patient with There is no height or weight on file to calculate BMI. who presents with the following:  She is a pleasant patient who is a new patient to me and is Dr. Royden Purl patient who presents with some ongoing heel and ankle pain.  This is on the right side.    Review of Systems is noted in the HPI, as appropriate  Objective:   LMP 02/15/2002   GEN: No acute distress; alert,appropriate. PULM: Breathing comfortably in no respiratory distress PSYCH: Normally interactive.   Laboratory and Imaging Data:  Assessment and Plan:   ***

## 2023-05-04 ENCOUNTER — Encounter: Payer: Self-pay | Admitting: Family Medicine

## 2023-05-04 ENCOUNTER — Ambulatory Visit (INDEPENDENT_AMBULATORY_CARE_PROVIDER_SITE_OTHER): Admitting: Family Medicine

## 2023-05-04 VITALS — BP 120/84 | HR 57 | Temp 97.2°F | Ht 62.0 in | Wt 172.0 lb

## 2023-05-04 DIAGNOSIS — M79671 Pain in right foot: Secondary | ICD-10-CM

## 2023-05-04 DIAGNOSIS — M25571 Pain in right ankle and joints of right foot: Secondary | ICD-10-CM

## 2023-05-04 DIAGNOSIS — M7671 Peroneal tendinitis, right leg: Secondary | ICD-10-CM | POA: Diagnosis not present

## 2023-05-04 NOTE — Patient Instructions (Signed)
Alleve 2 tabs by mouth two times a day over the counter: Take at least for 2 - 3 weeks. This is equal to a prescripton strength dose (GENERIC CHEAPER EQUIVALENT IS NAPROXEN SODIUM)   Voltaren 1% gel, over the counter You can apply up to 4 times a day  This can be applied to any joint: knee, wrist, fingers, elbows, shoulders, feet and ankles. Can apply to any tendon: tennis elbow, achilles, tendon, rotator cuff or any other tendon.  Minimal is absorbed in the bloodstream: ok with oral anti-inflammatory or a blood thinner.  Cost is about 9 dollars

## 2023-05-10 DIAGNOSIS — H524 Presbyopia: Secondary | ICD-10-CM | POA: Diagnosis not present

## 2023-05-11 ENCOUNTER — Encounter: Payer: Self-pay | Admitting: Family Medicine

## 2023-05-11 ENCOUNTER — Ambulatory Visit (INDEPENDENT_AMBULATORY_CARE_PROVIDER_SITE_OTHER): Payer: Medicare HMO | Admitting: Family Medicine

## 2023-05-11 VITALS — BP 134/78 | HR 58 | Temp 97.7°F | Ht 61.75 in | Wt 167.4 lb

## 2023-05-11 DIAGNOSIS — E2839 Other primary ovarian failure: Secondary | ICD-10-CM

## 2023-05-11 DIAGNOSIS — M8580 Other specified disorders of bone density and structure, unspecified site: Secondary | ICD-10-CM

## 2023-05-11 DIAGNOSIS — E7800 Pure hypercholesterolemia, unspecified: Secondary | ICD-10-CM

## 2023-05-11 DIAGNOSIS — F4321 Adjustment disorder with depressed mood: Secondary | ICD-10-CM

## 2023-05-11 DIAGNOSIS — Z0000 Encounter for general adult medical examination without abnormal findings: Secondary | ICD-10-CM | POA: Diagnosis not present

## 2023-05-11 DIAGNOSIS — I10 Essential (primary) hypertension: Secondary | ICD-10-CM | POA: Diagnosis not present

## 2023-05-11 DIAGNOSIS — Z1211 Encounter for screening for malignant neoplasm of colon: Secondary | ICD-10-CM | POA: Diagnosis not present

## 2023-05-11 DIAGNOSIS — R4589 Other symptoms and signs involving emotional state: Secondary | ICD-10-CM

## 2023-05-11 DIAGNOSIS — F4310 Post-traumatic stress disorder, unspecified: Secondary | ICD-10-CM

## 2023-05-11 NOTE — Patient Instructions (Addendum)
 Get on your calcium and D regularly  Put it in a pill box   Continue to be active  Add some strength training to your routine, this is important for bone and brain health and can reduce your risk of falls and help your body use insulin properly and regulate weight  Light weights, exercise bands , and internet videos are a good way to start  Yoga (chair or regular), machines , floor exercises or a gym with machines are also good options    Stop the sertraline  If any problems let us know   Wear sun protection  Take your ca and D    I will do some reseach into EMR - what is available

## 2023-05-11 NOTE — Assessment & Plan Note (Signed)
 Dexa 09/2022 reviewed  No falls or fracture Excellent exercise  Encouraged to take ca and D more regularly   Discussed fall prevention, supplements and exercise for bone density

## 2023-05-11 NOTE — Assessment & Plan Note (Signed)
 Colonoscopy 08/2020 with 5 y recall

## 2023-05-11 NOTE — Progress Notes (Signed)
 Subjective:    Patient ID: Jasmine Oliver, female    DOB: November 05, 1950, 74 y.o.   MRN: [id number]  HPI  Here for health maintenance exam and to review chronic medical problems   Wt Readings from Last 3 Encounters:  05/11/23 167 lb 6 oz (75.9 kg)  05/04/23 172 lb (78 kg)  02/26/23 155 lb (70.3 kg)   30.86 kg/m  Vitals:   05/11/23 0800  BP: 134/78  Pulse: (!) 58  Temp: 97.7 F (36.5 C)  SpO2: 98%    Immunization History  Administered Date(s) Administered   Fluad Quad(high Dose 65+) 11/17/2018, 12/10/2021   Hepatitis A 07/17/2002   Influenza Split 03/10/2012   Influenza Whole 11/16/2006, 10/16/2009   Influenza, High Dose Seasonal PF 09/29/2022   Influenza,inj,Quad PF,6+ Mos 01/20/2017, 01/17/2018   Influenza-Unspecified 01/22/2015, 12/07/2019, 11/17/2020, 12/10/2021   PFIZER Comirnaty(Gray Top)Covid-19 Tri-Sucrose Vaccine 06/27/2020   PFIZER(Purple Top)SARS-COV-2 Vaccination 04/07/2019, 04/30/2019, 11/09/2019   Pfizer Covid-19 Vaccine Bivalent Booster 65yrs & up 11/17/2020, 12/10/2021   Pfizer(Comirnaty)Fall Seasonal Vaccine 12 years and older 11/24/2022   Pneumococcal Conjugate-13 01/17/2019   Pneumococcal Polysaccharide-23 02/12/2020   Td 07/17/2002   Tdap 03/10/2012, 09/29/2022   Zoster Recombinant(Shingrix) 12/07/2018, 02/19/2019   Zoster, Live 05/20/2014    There are no preventive care reminders to display for this patient.  Just got back from [place]  Really loves it there    Mammogram 09/2022  Self breast exam- no lumps or changes   Gyn health No problems   Colon cancer screening  Colonoscopy 08/2020 5 y recall for polyps   Bone health  Dexa 09/2022  Falls-none Fractures-none  Supplements ca and D= irregularly    Exercise  Very active - lots of exercise in [place] Walking 10 mi per day with back pack  Some strength upper body work   Did injure tendon in her right ankle- saw Dr [facility] -utd December  Has visit next week  Is  erratic about sunscreen / getting better/ wears a hat   Going to [place] in May -  500 mile walk/ walking the [place]  (turning 72) - wants to reflect and re set     Mood    05/04/2023    8:40 AM 11/10/2022   10:15 AM 03/29/2022   10:40 AM 02/10/2022    9:21 AM 02/06/2021    9:05 AM  Depression screen PHQ 2/9  Decreased Interest 0 0 0 0 0  Down, Depressed, Hopeless 1 0 0 0 0  PHQ - 2 Score 1 0 0 0 0  Altered sleeping 2  1    Tired, decreased energy 0  0    Change in appetite 1  1    Feeling bad or failure about yourself  1  0    Trouble concentrating 1  0    Moving slowly or fidgety/restless 0  0    Suicidal thoughts 0  0    PHQ-9 Score 6  2    Difficult doing work/chores Not difficult at all  Not difficult at all     Grief reaction  Lost grandchild in accident this year  In counseling for this - 3 visits / was planning to try EMR    Has her ups and downs  Sertraline 25 mg daily  (wants to stop it)   Lorazepam prn -for sleep only when she needs it  Melatonin did not help much  Wants to avoid medication in general   Not a  good sleeper in general    HTN bp is stable today  No cp or palpitations or headaches or edema  No side effects to medicines  BP Readings from Last 3 Encounters:  05/11/23 134/78  05/04/23 120/84  03/10/23 138/80    Managed with lifestyle habits  Lab Results  Component Value Date   NA 146 (H) 03/25/2023   K 4.1 03/25/2023   CO2 28 03/25/2023   GLUCOSE 84 03/25/2023   BUN 18 03/25/2023   CREATININE 0.85 03/25/2023   CALCIUM 9.3 03/25/2023   GFR 69.57 03/25/2023   GFRNONAA 75.39 10/23/2009   Hyperlipidemia Lab Results  Component Value Date   CHOL 170 03/25/2023   CHOL 180 03/22/2022   CHOL 178 02/04/2021   Lab Results  Component Value Date   HDL 61.10 03/25/2023   HDL 67.00 03/22/2022   HDL 62.00 02/04/2021   Lab Results  Component Value Date   LDLCALC 101 (H) 03/25/2023   LDLCALC 99 03/22/2022   LDLCALC 103 (H) 02/04/2021    Lab Results  Component Value Date   TRIG 43.0 03/25/2023   TRIG 71.0 03/22/2022   TRIG 66.0 02/04/2021   Lab Results  Component Value Date   CHOLHDL 3 03/25/2023   CHOLHDL 3 03/22/2022   CHOLHDL 3 02/04/2021   No results found for: "LDLDIRECT"   Stable cholesterol   Brother had genetic heart valve issues   The 10-year ASCVD risk score (Arnett DK, et al., 2019) is: 9.7%   Values used to calculate the score:     Age: 64 years     Sex: Female     Is Non-Hispanic African American: No     Diabetic: No     Tobacco smoker: No     Systolic Blood Pressure: 134 mmHg     Is BP treated: No     HDL Cholesterol: 61.1 mg/dL     Total Cholesterol: 170 mg/dL     Lab Results  Component Value Date   ALT 9 03/25/2023   AST 17 03/25/2023   ALKPHOS 63 03/25/2023   BILITOT 0.5 03/25/2023   Lab Results  Component Value Date   TSH 1.28 03/25/2023   Lab Results  Component Value Date   WBC 4.8 03/25/2023   HGB 13.9 03/25/2023   HCT 42.3 03/25/2023   MCV 89.4 03/25/2023   PLT 239.0 03/25/2023        Patient Active Problem List   Diagnosis Date Noted   PTSD (post-traumatic stress disorder) 03/23/2023   Grief reaction 03/10/2023   History of trauma 03/10/2023   Depressed mood 03/10/2023   Osteopenia 09/19/2022   Encounter for screening mammogram for breast cancer 03/29/2022   Hyperlipidemia 02/12/2020   Estrogen deficiency 02/12/2020   Tinnitus 06/22/2017   Colon cancer screening 05/20/2015   Skin cancer screening 05/20/2015   Insomnia 10/12/2013   History of colon polyps 04/13/2011   Routine general medical examination at a health care facility 12/17/2010   Essential hypertension 10/27/2009   Past Medical History:  Diagnosis Date   Colon polyps    Elevated BP    without hypertension   Nevus    on left retina-- is watched by opthy   Past Surgical History:  Procedure Laterality Date   CESAREAN SECTION     COLONOSCOPY  2005   in [place]   TOTAL ABDOMINAL  HYSTERECTOMY     Social History   Tobacco Use   Smoking status: Never   Smokeless tobacco: Never  Substance  Use Topics   Alcohol use: Yes    Alcohol/week: 0.0 standard drinks of alcohol    Comment: wine weekly   Drug use: No   Family History  Problem Relation Age of Onset   Coronary artery disease Mother    Heart failure Mother    Hypertension Father    Coronary artery disease Father    Cancer Father        smoker- lung cancer    Hypertension Sister    ADD / ADHD Daughter    Alcohol abuse Son        in rehabilitation   ADD / ADHD Son    Colon cancer Neg Hx    Colon polyps Neg Hx    Esophageal cancer Neg Hx    Rectal cancer Neg Hx    Stomach cancer Neg Hx    No Known Allergies Current Outpatient Medications on File Prior to Visit  Medication Sig Dispense Refill   calcium carbonate (OS-CAL) 600 MG TABS tablet Take 1 tablet by mouth daily.     Cholecalciferol (VITAMIN D) 50 MCG (2000 UT) CAPS Take 1 capsule by mouth every other day.     diclofenac Sodium (VOLTAREN) 1 % GEL Apply 2 g topically daily as needed.     LORazepam (ATIVAN) 1 MG tablet Take 1 tablet (1 mg total) by mouth 2 (two) times daily as needed for anxiety or sleep. Caution of sedation 15 tablet 0   No current facility-administered medications on file prior to visit.    Review of Systems  Constitutional:  Negative for activity change, appetite change, fatigue, fever and unexpected weight change.  HENT:  Negative for congestion, ear pain, rhinorrhea, sinus pressure and sore throat.   Eyes:  Negative for pain, redness and visual disturbance.  Respiratory:  Negative for cough, shortness of breath and wheezing.   Cardiovascular:  Negative for chest pain and palpitations.  Gastrointestinal:  Negative for abdominal pain, blood in stool, constipation and diarrhea.  Endocrine: Negative for polydipsia and polyuria.  Genitourinary:  Negative for dysuria, frequency and urgency.  Musculoskeletal:  Negative for  arthralgias, back pain and myalgias.       Ankle tendonitis   Skin:  Negative for pallor and rash.  Allergic/Immunologic: Negative for environmental allergies.  Neurological:  Negative for dizziness, syncope and headaches.  Hematological:  Negative for adenopathy. Does not bruise/bleed easily.  Psychiatric/Behavioral:  Positive for dysphoric mood and sleep disturbance. Negative for decreased concentration. The patient is nervous/anxious.        Objective:   Physical Exam Constitutional:      General: She is not in acute distress.    Appearance: Normal appearance. She is well-developed. She is obese. She is not ill-appearing or diaphoretic.  HENT:     Head: Normocephalic and atraumatic.     Right Ear: Tympanic membrane, ear canal and external ear normal.     Left Ear: Tympanic membrane, ear canal and external ear normal.     Nose: Nose normal. No congestion.     Mouth/Throat:     Mouth: Mucous membranes are moist.     Pharynx: Oropharynx is clear. No posterior oropharyngeal erythema.  Eyes:     General: No scleral icterus.    Extraocular Movements: Extraocular movements intact.     Conjunctiva/sclera: Conjunctivae normal.     Pupils: Pupils are equal, round, and reactive to light.  Neck:     Thyroid: No thyromegaly.     Vascular: No carotid bruit or JVD.  Cardiovascular:     Rate and Rhythm: Normal rate and regular rhythm.     Pulses: Normal pulses.     Heart sounds: Normal heart sounds.     No gallop.  Pulmonary:     Effort: Pulmonary effort is normal. No respiratory distress.     Breath sounds: Normal breath sounds. No wheezing.     Comments: Good air exch Chest:     Chest wall: No tenderness.  Abdominal:     General: Bowel sounds are normal. There is no distension or abdominal bruit.     Palpations: Abdomen is soft. There is no mass.     Tenderness: There is no abdominal tenderness.     Hernia: No hernia is present.  Genitourinary:    Comments: Breast exam: No mass,  nodules, thickening, tenderness, bulging, retraction, inflamation, nipple discharge or skin changes noted.  No axillary or clavicular LA.     Musculoskeletal:        General: No tenderness. Normal range of motion.     Cervical back: Normal range of motion and neck supple. No rigidity. No muscular tenderness.     Right lower leg: No edema.     Left lower leg: No edema.     Comments: No kyphosis   Lymphadenopathy:     Cervical: No cervical adenopathy.  Skin:    General: Skin is warm and dry.     Coloration: Skin is not pale.     Findings: No erythema or rash.     Comments: Solar lentigines diffusely  Sks scattered   Neurological:     Mental Status: She is alert. Mental status is at baseline.     Cranial Nerves: No cranial nerve deficit.     Motor: No abnormal muscle tone.     Coordination: Coordination normal.     Gait: Gait normal.     Deep Tendon Reflexes: Reflexes are normal and symmetric. Reflexes normal.  Psychiatric:        Attention and Perception: Attention normal.        Mood and Affect: Mood normal.        Cognition and Memory: Cognition and memory normal.     Comments: Pleasant  Candidly discusses symptoms and stressors             Assessment & Plan:   Problem List Items Addressed This Visit       Cardiovascular and Mediastinum   Essential hypertension   Blood pressure up today s/p trauma with anxious mood  BP: 134/78  Encouraged self care Will continue to watch  No medicines currently-managed with good lifestyle         Musculoskeletal and Integument   Osteopenia   Dexa 09/2022 reviewed  No falls or fracture Excellent exercise  Encouraged to take ca and D more regularly   Discussed fall prevention, supplements and exercise for bone density          Other   Routine general medical examination at a health care facility - Primary   Reviewed health habits including diet and exercise and skin cancer prevention Reviewed appropriate screening tests  for age  Also reviewed health mt list, fam hx and immunization status , as well as social and family history   See HPI Labs reviewed and ordered Health Maintenance  Topic Date Due   COVID-19 Vaccine (8 - 2024-25 season) 05/25/2023   Mammogram  09/17/2023   Medicare Annual Wellness Visit  05/10/2024   Colon Cancer Screening  09/12/2025  DTaP/Tdap/Td vaccine (4 - Td or Tdap) 09/28/2032   Pneumonia Vaccine  Completed   Flu Shot  Completed   DEXA scan (bone density measurement)  Completed   Hepatitis C Screening  Completed   Zoster (Shingles) Vaccine  Completed   HPV Vaccine  Aged Out   Discussed fall prevention, supplements and exercise for bone density  Derm visit planned next wk/ discussed sun protection for skin cancer prevention  PHQ 6- has had treatment       PTSD (post-traumatic stress disorder)   Wants to get off sertraline Interested in [employer] treatment  Will look into options         Hyperlipidemia   Disc goals for lipids and reasons to control them Rev last labs with pt Rev low sat fat diet in detail  LDL is 101 Overall stable Good HDL Will continue to monitor        Grief reaction   Pt feels ready to come off of zoloft Uses lorazepam infrequently  Has done talk therapy  Interested in TMS therapy-will look into that       Depressed mood   Overall doing a bit better s/p trauma of losing grandchild Wishes to come off sertraline 25 mg  Has been in counseling Interested in option of TMS therapy  We will look into options for this  Encouraged self care (taking a long hike on [place] upcoming)  Good insight  Will alert [place] if she needs help      Colon cancer screening   Colonoscopy 08/2020 with 5 y recall

## 2023-05-11 NOTE — Assessment & Plan Note (Signed)
 Disc goals for lipids and reasons to control them Rev last labs with pt Rev low sat fat diet in detail  LDL is 101 Overall stable Good HDL Will continue to monitor

## 2023-05-11 NOTE — Assessment & Plan Note (Signed)
 Reviewed health habits including diet and exercise and skin cancer prevention Reviewed appropriate screening tests for age  Also reviewed health mt list, fam hx and immunization status , as well as social and family history   See HPI Labs reviewed and ordered Health Maintenance  Topic Date Due   COVID-19 Vaccine (8 - 2024-25 season) 05/25/2023   Mammogram  09/17/2023   Medicare Annual Wellness Visit  05/10/2024   Colon Cancer Screening  09/12/2025   DTaP/Tdap/Td vaccine (4 - Td or Tdap) 09/28/2032   Pneumonia Vaccine  Completed   Flu Shot  Completed   DEXA scan (bone density measurement)  Completed   Hepatitis C Screening  Completed   Zoster (Shingles) Vaccine  Completed   HPV Vaccine  Aged Out   Discussed fall prevention, supplements and exercise for bone density  Derm visit planned next wk/ discussed sun protection for skin cancer prevention  PHQ 6- has had treatment

## 2023-05-11 NOTE — Assessment & Plan Note (Signed)
 Pt feels ready to come off of zoloft Uses lorazepam infrequently  Has done talk therapy  Interested in TMS therapy-will look into that

## 2023-05-11 NOTE — Assessment & Plan Note (Signed)
 Blood pressure up today s/p trauma with anxious mood  BP: 134/78  Encouraged self care Will continue to watch  No medicines currently-managed with good lifestyle

## 2023-05-11 NOTE — Assessment & Plan Note (Signed)
 Wants to get off sertraline Interested in [employer] treatment  Will look into options

## 2023-05-11 NOTE — Assessment & Plan Note (Signed)
 Overall doing a bit better s/p trauma of losing grandchild Wishes to come off sertraline 25 mg  Has been in counseling Interested in option of TMS therapy  We will look into options for this  Encouraged self care (taking a long hike on [place] upcoming)  Good insight  Will alert [place] if she needs help

## 2023-05-12 ENCOUNTER — Encounter: Payer: Self-pay | Admitting: Internal Medicine

## 2023-05-12 ENCOUNTER — Ambulatory Visit: Admitting: Internal Medicine

## 2023-05-12 VITALS — BP 110/80 | HR 66 | Temp 98.4°F | Ht 61.75 in | Wt 168.0 lb

## 2023-05-12 DIAGNOSIS — J029 Acute pharyngitis, unspecified: Secondary | ICD-10-CM | POA: Diagnosis not present

## 2023-05-12 DIAGNOSIS — J020 Streptococcal pharyngitis: Secondary | ICD-10-CM | POA: Diagnosis not present

## 2023-05-12 LAB — POCT INFLUENZA A/B
Influenza A, POC: NEGATIVE
Influenza B, POC: NEGATIVE

## 2023-05-12 LAB — POCT RAPID STREP A (OFFICE): Rapid Strep A Screen: POSITIVE — AB

## 2023-05-12 LAB — POC COVID19 BINAXNOW: SARS Coronavirus 2 Ag: NEGATIVE

## 2023-05-12 MED ORDER — PENICILLIN V POTASSIUM 500 MG PO TABS: 500.0000 mg | ORAL_TABLET | Freq: Three times a day (TID) | ORAL | 0 refills | Status: AC

## 2023-05-12 NOTE — Progress Notes (Signed)
 Subjective:    Patient ID: Jasmine Oliver, female    DOB: Jul 10, 1950, 73 y.o.   MRN: [id number]  HPI Here due to respiratory illness  Here yesterday for PE--doing fine Went to the gym and then went home Started to have sore throat and muscle aches Then by evening--sore throat worse and headache/fatigue--had to bring granddaughter to softball game  Son also sick--vomiting Granddaughter not sick though  Taking advil and tylenol No clear fever Slight chills No cough No SOB---or just slight Frontal headache--dayquil helped. No sinus congestion  Current Outpatient Medications on File Prior to Visit  Medication Sig Dispense Refill   calcium carbonate (OS-CAL) 600 MG TABS tablet Take 1 tablet by mouth daily.     Cholecalciferol (VITAMIN D) 50 MCG (2000 UT) CAPS Take 1 capsule by mouth every other day.     diclofenac Sodium (VOLTAREN) 1 % GEL Apply 2 g topically daily as needed.     LORazepam (ATIVAN) 1 MG tablet Take 1 tablet (1 mg total) by mouth 2 (two) times daily as needed for anxiety or sleep. Caution of sedation 15 tablet 0   No current facility-administered medications on file prior to visit.    No Known Allergies  Past Medical History:  Diagnosis Date   Colon polyps    Elevated BP    without hypertension   Nevus    on left retina-- is watched by opthy    Past Surgical History:  Procedure Laterality Date   CESAREAN SECTION     COLONOSCOPY  2005   in [place]   TOTAL ABDOMINAL HYSTERECTOMY      Family History  Problem Relation Age of Onset   Coronary artery disease Mother    Heart failure Mother    Hypertension Father    Coronary artery disease Father    Cancer Father        smoker- lung cancer    Hypertension Sister    ADD / ADHD Daughter    Alcohol abuse Son        in rehabilitation   ADD / ADHD Son    Colon cancer Neg Hx    Colon polyps Neg Hx    Esophageal cancer Neg Hx    Rectal cancer Neg Hx    Stomach cancer Neg Hx     Social History    Socioeconomic History   Marital status: Married    Spouse name: Not on file   Number of children: 4   Years of education: Not on file   Highest education level: Bachelor's degree (e.g., BA, AB, BS)  Occupational History   Occupation: Nature conservation officer  Tobacco Use   Smoking status: Never   Smokeless tobacco: Never  Substance and Sexual Activity   Alcohol use: Yes    Alcohol/week: 0.0 standard drinks of alcohol    Comment: wine weekly   Drug use: No   Sexual activity: Not on file  Other Topics Concern   Not on file  Social History Narrative   Plays Golf and goes to the gym   Social Drivers of Health   Financial Resource Strain: Low Risk  (05/03/2023)   Overall Financial Resource Strain (CARDIA)    Difficulty of Paying Living Expenses: Not hard at all  Food Insecurity: No Food Insecurity (05/03/2023)   Hunger Vital Sign    Worried About Running Out of Food in the Last Year: Never true    Ran Out of Food in the Last Year: Never true  Transportation Needs: No  Transportation Needs (05/03/2023)   PRAPARE - Administrator, Civil Service (Medical): No    Lack of Transportation (Non-Medical): No  Physical Activity: Sufficiently Active (05/03/2023)   Exercise Vital Sign    Days of Exercise per Week: 5 days    Minutes of Exercise per Session: 60 min  Stress: Stress Concern Present (05/03/2023)   [employer] of Occupational Health - Occupational Stress Questionnaire    Feeling of Stress : Very much  Social Connections: Socially Integrated (05/03/2023)   Social Connection and Isolation Panel [NHANES]    Frequency of Communication with Friends and Family: More than three times a week    Frequency of Social Gatherings with Friends and Family: More than three times a week    Attends Religious Services: 1 to 4 times per year    Active Member of [employer] or Organizations: Yes    Attends Engineer, structural: More than 4 times per year    Marital Status: Married   Catering manager Violence: Not At Risk (11/10/2022)   Humiliation, Afraid, Rape, and Kick questionnaire    Fear of Current or Ex-Partner: No    Emotionally Abused: No    Physically Abused: No    Sexually Abused: No   Review of Systems No rash No N/V Appetite off--but able to eat and drink    Objective:   Physical Exam Constitutional:      Appearance: Normal appearance.  HENT:     Right Ear: Tympanic membrane and ear canal normal.     Left Ear: Tympanic membrane and ear canal normal.     Mouth/Throat:     Comments: Pharyngeal injection No exudates or focal swelling Neck:     Comments: Mild tenderness at anterior cervical nodes (though not really enlarged) Pulmonary:     Effort: Pulmonary effort is normal.     Breath sounds: Normal breath sounds. No wheezing or rales.  Musculoskeletal:     Cervical back: Neck supple.  Neurological:     Mental Status: She is alert.            Assessment & Plan:

## 2023-05-12 NOTE — Assessment & Plan Note (Signed)
 COVID and flu tests negative  Continue analgesics PCN VK 500 tid x 10 days  Isolate today

## 2023-05-12 NOTE — Addendum Note (Signed)
 Addended by: Eual Fines on: 05/12/2023 09:40 AM   Modules accepted: Orders

## 2023-05-16 DIAGNOSIS — K080 Exfoliation of teeth due to systemic causes: Secondary | ICD-10-CM | POA: Diagnosis not present

## 2023-05-26 ENCOUNTER — Encounter: Payer: Self-pay | Admitting: Family Medicine

## 2023-06-20 ENCOUNTER — Telehealth: Payer: Self-pay | Admitting: Family Medicine

## 2023-06-20 NOTE — Telephone Encounter (Signed)
 We tried to refer pt for TMS therapy for depressed mood  Received the following    Hello Dr. Malissa Se! I am the TMS Coordinator and I've received your referral for this pt. Not sure if we spoke recently, but this pt was denied for TMS back in March. Unfortunately, that decision still stands, but here is the explanation why: With our program at [facility], pt's must have either a MDD or OCD dx (co-morbidity ok) and will need to have trialed at least 2 primary meds and 2 augmenting. We cannot treat anything off-label. Please let me know if you have any other questions, thanks!   Please let pt know they could not take her based on that

## 2023-06-21 NOTE — Telephone Encounter (Signed)
 LM to call back so we can inform her about this referral update

## 2023-06-23 NOTE — Telephone Encounter (Signed)
 Left VM requesting pt to call the office back

## 2023-06-28 NOTE — Telephone Encounter (Signed)
 It looks like to be considered for this therapy she would need to have failed 2 anti depressants and 2 augmenting medicines or have severe OCD which she does not  I do not have a way to get her approved unfortunately  We can consider a psychiatry referral to discuss medication if she wants to (I know she was wanting to get off medicine however)  If she knows of a place that does this therapy where she could go out of pocket that may be an option but she would need to do some research as I am unaware   Let me know what she thinks

## 2023-06-28 NOTE — Telephone Encounter (Signed)
 Copied from CRM [id number]. Topic: General - Other >> Jun 28, 2023  3:40 PM Allyne Areola wrote: Reason for CRM: Patient is returning a call she received from [facility] [place] [facility].. Informed of the message from Dr.Tower. Patient would like to know the next steps.

## 2023-06-29 NOTE — Telephone Encounter (Signed)
 Pt notified of Dr. Belva Boyden comments. Pt said that she is out of the country doing things to help her but if she comes back home and still feels like she needs therapy she will call her insurance to see who is covered

## 2023-11-15 ENCOUNTER — Ambulatory Visit

## 2023-11-15 VITALS — BP 120/80 | Ht 61.75 in | Wt 170.0 lb

## 2023-11-15 DIAGNOSIS — Z0000 Encounter for general adult medical examination without abnormal findings: Secondary | ICD-10-CM

## 2023-11-15 NOTE — Progress Notes (Signed)
 Because this visit was a virtual/telehealth visit,  certain criteria was not obtained, such a blood pressure, CBG if applicable, and timed get up and go. Any medications not marked as taking were not mentioned during the medication reconciliation part of the visit. Any vitals not documented were not able to be obtained due to this being a telehealth visit or patient was unable to self-report a recent blood pressure reading due to a lack of equipment at home via telehealth. Vitals that have been documented are verbally provided by the patient.  This visit was performed by a medical professional under my direct supervision. I was immediately available for consultation/collaboration. I have reviewed and agree with the Annual Wellness Visit documentation.  Subjective:   Jasmine Oliver is a 73 y.o. who presents for a Medicare Wellness preventive visit.  As a reminder, Annual Wellness Visits don't include a physical exam, and some assessments may be limited, especially if this visit is performed virtually. We may recommend an in-person follow-up visit with your provider if needed.  Visit Complete: Virtual I connected with  Jasmine Oliver on 11/15/23 by a audio enabled telemedicine application and verified that I am speaking with the correct person using two identifiers.  Patient Location: Home  Provider Location: Home Office  I discussed the limitations of evaluation and management by telemedicine. The patient expressed understanding and agreed to proceed.  Vital Signs: Because this visit was a virtual/telehealth visit, some criteria may be missing or patient reported. Any vitals not documented were not able to be obtained and vitals that have been documented are patient reported.  VideoDeclined- This patient declined Librarian, academic. Therefore the visit was completed with audio only.  Persons Participating in Visit: Patient.  AWV Questionnaire: No: Patient Medicare AWV  questionnaire was not completed prior to this visit.  Cardiac Risk Factors include: advanced age (>39men, >57 women);obesity (BMI >30kg/m2);dyslipidemia;hypertension     Objective:    Today's Vitals   11/15/23 0852 11/15/23 0853  BP: 120/80   Weight: 170 lb (77.1 kg)   Height: 5' 1.75 (1.568 m)   PainSc:  2    Body mass index is 31.35 kg/m.     11/15/2023    8:51 AM 11/10/2022   10:17 AM 02/10/2022    9:28 AM 02/06/2021    9:00 AM 02/06/2020    8:56 AM 08/04/2015    8:45 AM 07/21/2015    9:59 AM  Advanced Directives  Does Patient Have a Medical Advance Directive? Yes Yes Yes Yes No No  No   Type of Estate agent of [place];Living will Healthcare Power of [place];Living will Healthcare Power of [place];Living will Healthcare Power of [place];Living will     Does patient want to make changes to medical advance directive? No - Patient declined  No - Patient declined Yes (MAU/Ambulatory/Procedural Areas - Information given)     Copy of Healthcare Power of Attorney in Chart? No - copy requested  No - copy requested      Would patient like information on creating a medical advance directive?     Yes (MAU/Ambulatory/Procedural Areas - Information given) No - patient declined information       Data saved with a previous flowsheet row definition    Current Medications (verified) Outpatient Encounter Medications as of 11/15/2023  Medication Sig   calcium carbonate (OS-CAL) 600 MG TABS tablet Take 1 tablet by mouth daily.   Cholecalciferol (VITAMIN D) 50 MCG (2000 UT) CAPS Take 1 capsule by  mouth every other day.   diclofenac Sodium (VOLTAREN) 1 % GEL Apply 2 g topically daily as needed.   LORazepam  (ATIVAN ) 1 MG tablet Take 1 tablet (1 mg total) by mouth 2 (two) times daily as needed for anxiety or sleep. Caution of sedation   No facility-administered encounter medications on file as of 11/15/2023.    Allergies (verified) Patient has no known allergies.    History: Past Medical History:  Diagnosis Date   Colon polyps    Elevated BP    without hypertension   Nevus    on left retina-- is watched by opthy   Past Surgical History:  Procedure Laterality Date   CESAREAN SECTION     COLONOSCOPY  2005   in [place]    TOTAL ABDOMINAL HYSTERECTOMY     Family History  Problem Relation Age of Onset   Coronary artery disease Mother    Heart failure Mother    Hypertension Father    Coronary artery disease Father    Cancer Father        smoker- lung cancer    Hypertension Sister    ADD / ADHD Daughter    Alcohol abuse Son        in rehabilitation   ADD / ADHD Son    Colon cancer Neg Hx    Colon polyps Neg Hx    Esophageal cancer Neg Hx    Rectal cancer Neg Hx    Stomach cancer Neg Hx    Social History   Socioeconomic History   Marital status: Married    Spouse name: Not on file   Number of children: 4   Years of education: Not on file   Highest education level: Bachelor's degree (e.g., BA, AB, BS)  Occupational History   Occupation: Nature conservation officer  Tobacco Use   Smoking status: Never   Smokeless tobacco: Never  Substance and Sexual Activity   Alcohol use: Yes    Alcohol/week: 0.0 standard drinks of alcohol    Comment: wine weekly   Drug use: No   Sexual activity: Not on file  Other Topics Concern   Not on file  Social History Narrative   Plays Golf and goes to the gym   Social Drivers of Health   Financial Resource Strain: Low Risk  (11/15/2023)   Overall Financial Resource Strain (CARDIA)    Difficulty of Paying Living Expenses: Not hard at all  Food Insecurity: No Food Insecurity (11/15/2023)   Hunger Vital Sign    Worried About Running Out of Food in the Last Year: Never true    Ran Out of Food in the Last Year: Never true  Transportation Needs: No Transportation Needs (11/15/2023)   PRAPARE - Administrator, Civil Service (Medical): No    Lack of Transportation (Non-Medical): No  Physical  Activity: Sufficiently Active (11/15/2023)   Exercise Vital Sign    Days of Exercise per Week: 5 days    Minutes of Exercise per Session: 60 min  Stress: Stress Concern Present (11/15/2023)   [employer] of Occupational Health - Occupational Stress Questionnaire    Feeling of Stress: Very much  Social Connections: Socially Integrated (11/15/2023)   Social Connection and Isolation Panel    Frequency of Communication with Friends and Family: More than three times a week    Frequency of Social Gatherings with Friends and Family: More than three times a week    Attends Religious Services: 1 to 4 times per year  Active Member of Clubs or Organizations: Yes    Attends Engineer, structural: More than 4 times per year    Marital Status: Married    Tobacco Counseling Counseling given: Not Answered    Clinical Intake:  Pre-visit preparation completed: Yes  Pain : 0-10 Pain Score: 2  Pain Type: Acute pain Pain Location: Foot Pain Orientation: Left Pain Descriptors / Indicators: Aching Pain Onset: Today Pain Frequency: Rarely     BMI - recorded: 31.35 Nutritional Status: BMI > 30  Obese Nutritional Risks: None Diabetes: No  No results found for: HGBA1C   How often do you need to have someone help you when you read instructions, pamphlets, or other written materials from your doctor or pharmacy?: 1 - Never  Interpreter Needed?: No  Information entered by :: Jasmine Oliver,CMA   Activities of Daily Living     11/15/2023    8:55 AM  In your present state of health, do you have any difficulty performing the following activities:  Hearing? 0  Vision? 0  Difficulty concentrating or making decisions? 0  Walking or climbing stairs? 0  Dressing or bathing? 0  Doing errands, shopping? 0  Preparing Food and eating ? N  Using the Toilet? N  In the past six months, have you accidently leaked urine? Y  Do you have problems with loss of bowel control? N   Managing your Medications? N  Managing your Finances? N  Housekeeping or managing your Housekeeping? N    Patient Care Team: Tower, Jasmine LABOR, MD as PCP - General  I have updated your Care Teams any recent Medical Services you may have received from other providers in the past year.     Assessment:   This is a routine wellness examination for Jasmine Oliver.  Hearing/Vision screen Hearing Screening - Comments:: Patient has no difficulties  Vision Screening - Comments:: Patient wears readers    Goals Addressed               This Visit's Progress     I would like to lose about 10-15lb (pt-stated)   On track     Portion control Reduce sugar intake Stay hydrated       Depression Screen     11/15/2023    8:57 AM 05/04/2023    8:40 AM 11/10/2022   10:15 AM 03/29/2022   10:40 AM 02/10/2022    9:21 AM 02/06/2021    9:05 AM 02/06/2020    8:57 AM  PHQ 2/9 Scores  PHQ - 2 Score 2 1 0 0 0 0 0  PHQ- 9 Score 6 6  2    0    Fall Risk     11/15/2023    8:54 AM 11/10/2022    9:36 AM 03/29/2022   10:40 AM 02/10/2022    9:21 AM 02/10/2022    9:07 AM  Fall Risk   Falls in the past year? 0 0 0 0 0  Number falls in past yr: 0  0 0 0  Injury with Fall? 0  0 0 0  Risk for fall due to : No Fall Risks No Fall Risks No Fall Risks No Fall Risks   Follow up Falls evaluation completed Education provided;Falls prevention discussed Falls evaluation completed Falls evaluation completed       Data saved with a previous flowsheet row definition    MEDICARE RISK AT HOME:  Medicare Risk at Home Any stairs in or around the home?: No If so, are there  any without handrails?: No Home free of loose throw rugs in walkways, pet beds, electrical cords, etc?: Yes Adequate lighting in your home to reduce risk of falls?: Yes Life alert?: No Use of a cane, walker or w/c?: No Grab bars in the bathroom?: No Shower chair or bench in shower?: Yes Elevated toilet seat or a handicapped toilet?: Yes  TIMED UP  AND GO:  Was the test performed?  No  Cognitive Function: 6CIT completed    02/06/2020    9:00 AM  MMSE - Mini Mental State Exam  Orientation to time 5  Orientation to Place 5  Registration 3  Attention/ Calculation 5  Recall 3  Language- repeat 1        11/15/2023    8:59 AM 11/10/2022   10:18 AM 02/10/2022    9:29 AM  6CIT Screen  What Year? 0 points 0 points 0 points  What month? 0 points 0 points 0 points  What time? 0 points 0 points 0 points  Count back from 20 0 points 0 points 0 points  Months in reverse 0 points 0 points 0 points  Repeat phrase 0 points 0 points 0 points  Total Score 0 points 0 points 0 points    Immunizations Immunization History  Administered Date(s) Administered   Fluad Quad(high Dose 65+) 11/17/2018, 12/10/2021   Hepatitis A 07/17/2002   INFLUENZA, HIGH DOSE SEASONAL PF 09/29/2022   Influenza Split 03/10/2012   Influenza Whole 11/16/2006, 10/16/2009   Influenza,inj,Quad PF,6+ Mos 01/20/2017, 01/17/2018   Influenza-Unspecified 01/22/2015, 12/07/2019, 11/17/2020, 12/10/2021   PFIZER Comirnaty(Gray Top)Covid-19 Tri-Sucrose Vaccine 06/27/2020   PFIZER(Purple Top)SARS-COV-2 Vaccination 04/07/2019, 04/30/2019, 11/09/2019   Pfizer Covid-19 Vaccine Bivalent Booster 56yrs & up 11/17/2020, 12/10/2021   Pfizer(Comirnaty)Fall Seasonal Vaccine 12 years and older 11/24/2022   Pneumococcal Conjugate-13 01/17/2019   Pneumococcal Polysaccharide-23 02/12/2020   Td 07/17/2002   Tdap 03/10/2012, 09/29/2022   Zoster Recombinant(Shingrix) 12/07/2018, 02/19/2019   Zoster, Live 05/20/2014    Screening Tests Health Maintenance  Topic Date Due   Influenza Vaccine  09/16/2023   COVID-19 Vaccine (8 - 2024-25 season) 10/17/2023   Mammogram  09/17/2023   Medicare Annual Wellness (AWV)  11/14/2024   Colonoscopy  09/12/2025   DTaP/Tdap/Td (4 - Td or Tdap) 09/28/2032   Pneumococcal Vaccine: 50+ Years  Completed   DEXA SCAN  Completed   Hepatitis C Screening   Completed   Zoster Vaccines- Shingrix  Completed   HPV VACCINES  Aged Out   Meningococcal B Vaccine  Aged Out    Health Maintenance Items Addressed:patient declined   Additional Screening:  Vision Screening: Recommended annual ophthalmology exams for early detection of glaucoma and other disorders of the eye. Is the patient up to date with their annual eye exam?  No  Who is the provider or what is the name of the office in which the patient attends annual eye exams?   Dental Screening: Recommended annual dental exams for proper oral hygiene  Community Resource Referral / Chronic Care Management: CRR required this visit?  No   CCM required this visit?  No   Plan:    I have personally reviewed and noted the following in the patient's chart:   Medical and social history Use of alcohol, tobacco or illicit drugs  Current medications and supplements including opioid prescriptions. Patient is not currently taking opioid prescriptions. Functional ability and status Nutritional status Physical activity Advanced directives List of other physicians Hospitalizations, surgeries, and ER visits in previous 12 months  Vitals Screenings to include cognitive, depression, and falls Referrals and appointments  In addition, I have reviewed and discussed with patient certain preventive protocols, quality metrics, and best practice recommendations. A written personalized care plan for preventive services as well as general preventive health recommendations were provided to patient.   Lyle MARLA Right, [place]   11/15/2023   After Visit Summary: (MyChart) Due to this being a telephonic visit, the after visit summary with patients personalized plan was offered to patient via MyChart   Notes: Nothing significant to report at this time.

## 2023-11-15 NOTE — Patient Instructions (Signed)
 Jasmine Oliver,  Thank you for taking the time for your Medicare Wellness Visit. I appreciate your continued commitment to your health goals. Please review the care plan we discussed, and feel free to reach out if I can assist you further.  Medicare recommends these wellness visits once per year to help you and your care team stay ahead of potential health issues. These visits are designed to focus on prevention, allowing your provider to concentrate on managing your acute and chronic conditions during your regular appointments.  Please note that Annual Wellness Visits do not include a physical exam. Some assessments may be limited, especially if the visit was conducted virtually. If needed, we may recommend a separate in-person follow-up with your provider.  Ongoing Care Seeing your primary care provider every 3 to 6 months helps us  monitor your health and provide consistent, personalized care.   Referrals If a referral was made during today's visit and you haven't received any updates within two weeks, please contact the referred provider directly to check on the status.  Recommended Screenings:  Health Maintenance  Topic Date Due   Flu Shot  09/16/2023   COVID-19 Vaccine (8 - 2024-25 season) 10/17/2023   Breast Cancer Screening  09/17/2023   Medicare Annual Wellness Visit  11/14/2024   Colon Cancer Screening  09/12/2025   DTaP/Tdap/Td vaccine (4 - Td or Tdap) 09/28/2032   Pneumococcal Vaccine for age over 50  Completed   DEXA scan (bone density measurement)  Completed   Hepatitis C Screening  Completed   Zoster (Shingles) Vaccine  Completed   HPV Vaccine  Aged Out   Meningitis B Vaccine  Aged Out       11/15/2023    8:51 AM  Advanced Directives  Does Patient Have a Medical Advance Directive? Yes  Type of Estate agent of [place];Living will  Does patient want to make changes to medical advance directive? No - Patient declined  Copy of Healthcare Power of  Attorney in Chart? No - copy requested   Advance Care Planning is important because it: Ensures you receive medical care that aligns with your values, goals, and preferences. Provides guidance to your family and loved ones, reducing the emotional burden of decision-making during critical moments.  Vision: Annual vision screenings are recommended for early detection of glaucoma, cataracts, and diabetic retinopathy. These exams can also reveal signs of chronic conditions such as diabetes and high blood pressure.  Dental: Annual dental screenings help detect early signs of oral cancer, gum disease, and other conditions linked to overall health, including heart disease and diabetes.  Please see the attached documents for additional preventive care recommendations.

## 2023-11-16 ENCOUNTER — Encounter: Payer: Self-pay | Admitting: Family Medicine

## 2023-11-16 ENCOUNTER — Ambulatory Visit (INDEPENDENT_AMBULATORY_CARE_PROVIDER_SITE_OTHER): Admitting: Family Medicine

## 2023-11-16 VITALS — BP 118/84 | HR 74 | Temp 97.7°F | Ht 61.75 in | Wt 172.0 lb

## 2023-11-16 DIAGNOSIS — M79672 Pain in left foot: Secondary | ICD-10-CM | POA: Diagnosis not present

## 2023-11-16 DIAGNOSIS — Z23 Encounter for immunization: Secondary | ICD-10-CM

## 2023-11-16 DIAGNOSIS — M79671 Pain in right foot: Secondary | ICD-10-CM | POA: Diagnosis not present

## 2023-11-16 NOTE — Patient Instructions (Addendum)
 Keep using voltaren gel Try some ice (roll foot over a frozen water bottle for 5 minutes each) when you can   Don't go barefoot  Stick to shoes with support (the harder the sole, the better)   Stop at check out to make appointment with Dr Watt for further evaluation   Flu shot today

## 2023-11-16 NOTE — Assessment & Plan Note (Addendum)
 In active obese female with high arches who walks long distances  Mild bunion deformity  Tender over calcaneus and insertion of achilles  Worse with first steps after rest  Suspect plantar fasciitis   Encouraged use of voltaren gel Discussed cold therapy/stretching with frozen water bottle  Shoes at all times  Hard sole/supportive as much as possible   Exercise- breaks if needed or cross train   May benefit from orthotics in future Ref to sport med   Call back and Er precautions noted in detail today

## 2023-11-16 NOTE — Progress Notes (Signed)
 Subjective:    Patient ID: Jasmine Oliver, female    DOB: [birth year], 73 y.o.   MRN: [id number]  HPI  Wt Readings from Last 3 Encounters:  11/16/23 172 lb (78 kg)  11/15/23 170 lb (77.1 kg)  05/12/23 168 lb (76.2 kg)   31.71 kg/m  Vitals:   11/16/23 1027  BP: 118/84  Pulse: 74  Temp: 97.7 F (36.5 C)  SpO2: 98%    Pt presents for  Foot pain  Flu shot   During her big hike - got through ok   Now both feet bother her  A little on bottom of heel  Goes up achilles on both sides  (pain 3-4 max out of 10) Tries to stretch it out   Worse with first few steps  No swelling  No redness   No change in shoes Occational wears flip flops  Wears her athletic shoes for her walking  (very supportive in the arch and firm heel)  A little worse when barefoot    Uses voltaren gel- cannot tell if helpful    Saw Dr Watt in march for right heel and ankle pain  Dx of peroneal tendonitis right   (thinks that is doing better)  Recommended voltaren topical/oral nsaid and ASO ankle brace  Some ? Of plantar fasciitis vs heel bruise   MWF walks 5 mi and goes to gym Golf other days (4 mi with that)      Patient Active Problem List   Diagnosis Date Noted   Heel pain, bilateral 11/16/2023   Strep pharyngitis 05/12/2023   PTSD (post-traumatic stress disorder) 03/23/2023   Grief reaction 03/10/2023   History of trauma 03/10/2023   Depressed mood 03/10/2023   Osteopenia 09/19/2022   Encounter for screening mammogram for breast cancer 03/29/2022   Hyperlipidemia 02/12/2020   Estrogen deficiency 02/12/2020   Tinnitus 06/22/2017   Colon cancer screening 05/20/2015   Skin cancer screening 05/20/2015   Insomnia 10/12/2013   History of colon polyps 04/13/2011   Routine general medical examination at a health care facility 12/17/2010   Essential hypertension 10/27/2009   Past Medical History:  Diagnosis Date   Colon polyps    Elevated BP    without hypertension   Nevus     on left retina-- is watched by opthy   Past Surgical History:  Procedure Laterality Date   CESAREAN SECTION     COLONOSCOPY  2005   in [place]    TOTAL ABDOMINAL HYSTERECTOMY     Social History   Tobacco Use   Smoking status: Never   Smokeless tobacco: Never  Substance Use Topics   Alcohol use: Yes    Alcohol/week: 0.0 standard drinks of alcohol    Comment: wine weekly   Drug use: No   Family History  Problem Relation Age of Onset   Coronary artery disease Mother    Heart failure Mother    Hypertension Father    Coronary artery disease Father    Cancer Father        smoker- lung cancer    Hypertension Sister    ADD / ADHD Daughter    Alcohol abuse Son        in rehabilitation   ADD / ADHD Son    Colon cancer Neg Hx    Colon polyps Neg Hx    Esophageal cancer Neg Hx    Rectal cancer Neg Hx    Stomach cancer Neg Hx    No Known Allergies Current Outpatient  Medications on File Prior to Visit  Medication Sig Dispense Refill   calcium carbonate (OS-CAL) 600 MG TABS tablet Take 1 tablet by mouth daily.     Cholecalciferol (VITAMIN D) 50 MCG (2000 UT) CAPS Take 1 capsule by mouth every other day.     diclofenac Sodium (VOLTAREN) 1 % GEL Apply 2 g topically daily as needed.     No current facility-administered medications on file prior to visit.    Review of Systems  Constitutional:  Negative for activity change, appetite change, fatigue, fever and unexpected weight change.  HENT:  Negative for congestion, ear pain, rhinorrhea, sinus pressure and sore throat.   Eyes:  Negative for pain, redness and visual disturbance.  Respiratory:  Negative for cough, shortness of breath and wheezing.   Cardiovascular:  Negative for chest pain and palpitations.  Gastrointestinal:  Negative for abdominal pain, blood in stool, constipation and diarrhea.  Endocrine: Negative for polydipsia and polyuria.  Genitourinary:  Negative for dysuria, frequency and urgency.  Musculoskeletal:   Negative for arthralgias, back pain and myalgias.       Heel pain bilateral    Skin:  Negative for pallor and rash.  Allergic/Immunologic: Negative for environmental allergies.  Neurological:  Negative for dizziness, syncope and headaches.  Hematological:  Negative for adenopathy. Does not bruise/bleed easily.  Psychiatric/Behavioral:  Negative for decreased concentration and dysphoric mood. The patient is not nervous/anxious.        Objective:   Physical Exam Constitutional:      General: She is not in acute distress.    Appearance: Normal appearance. She is not ill-appearing or diaphoretic.  Cardiovascular:     Rate and Rhythm: Normal rate and regular rhythm.  Musculoskeletal:     Right lower leg: No edema.     Left lower leg: No edema.     Right foot: Normal range of motion and normal capillary refill. Bony tenderness present. No swelling, deformity, bunion, Charcot foot, tenderness or crepitus. Normal pulse.     Left foot: Normal range of motion and normal capillary refill. Bunion, tenderness and bony tenderness present. No swelling, deformity, Charcot foot or crepitus. Normal pulse.     Comments: Mild tenderness of calcaneus bilataerally -worse on left  Also achilles insertion   Normal rom toes and ankles  High arches   Pain with first few steps after sitting   Skin:    Coloration: Skin is not jaundiced.     Findings: No bruising, erythema, lesion or rash.  Neurological:     General: No focal deficit present.     Mental Status: She is alert.     Sensory: No sensory deficit.     Motor: No weakness.     Coordination: Coordination normal.     Deep Tendon Reflexes: Reflexes normal.  Psychiatric:        Mood and Affect: Mood normal.           Assessment & Plan:   Problem List Items Addressed This Visit       Other   Heel pain, bilateral - Primary   In active obese female with high arches who walks long distances  Mild bunion deformity  Tender over calcaneus  and insertion of achilles  Worse with first steps after rest  Suspect plantar fasciitis   Encouraged use of voltaren gel Discussed cold therapy/stretching with frozen water bottle  Shoes at all times  Hard sole/supportive as much as possible   Exercise- breaks if needed or cross train  May benefit from orthotics in future Ref to sport med   Call back and Er precautions noted in detail today        Other Visit Diagnoses       Need for influenza vaccination       Relevant Orders   Flu vaccine HIGH DOSE PF(Fluzone Trivalent) (Completed)

## 2023-11-30 ENCOUNTER — Ambulatory Visit (INDEPENDENT_AMBULATORY_CARE_PROVIDER_SITE_OTHER): Admitting: Family Medicine

## 2023-11-30 ENCOUNTER — Encounter: Payer: Self-pay | Admitting: Family Medicine

## 2023-11-30 VITALS — BP 120/82 | HR 73 | Temp 98.5°F | Ht 61.75 in | Wt 174.2 lb

## 2023-11-30 DIAGNOSIS — M7661 Achilles tendinitis, right leg: Secondary | ICD-10-CM

## 2023-11-30 DIAGNOSIS — M7662 Achilles tendinitis, left leg: Secondary | ICD-10-CM | POA: Diagnosis not present

## 2023-11-30 NOTE — Progress Notes (Signed)
 Ilario Dhaliwal T. Juddson Cobern, MD, CAQ Sports Medicine [facility] at [facility] [address] [place], [address]  Phone: [phone]  FAX: [phone]  Jasmine Oliver - 72 y.o. female  MRN [id number]  Date of Birth: 07-Feb-1951  Date: 11/30/2023  PCP: Randeen Laine LABOR, MD  Referral: Randeen Laine LABOR, MD  Chief Complaint  Patient presents with   Foot Pain   Subjective:   Jasmine Oliver is a 73 y.o. very pleasant female patient with Body mass index is 32.13 kg/m. who presents with the following:  Discussed the use of AI scribe software for clinical note transcription with the patient, who gave verbal consent to proceed.   History of Present Illness Jasmine Oliver Jasmine Oliver is a 73 year old female who presents with bilateral heel pain.  She has been experiencing bilateral heel pain since May, initially affecting only the right heel. The pain is described as shooting up the back of the heel and sometimes radiating to the legs. It is most noticeable in the mornings, requiring time to 'work my way into it'. The pain level is reported as 2 to 3 out of 10.  Initially, the pain was localized to the bottom of the right heel but has since involved both heels, with tenderness noted at the back of the heel and along the Achilles tendon.  She has been using a water bottle for rolling exercises and applying Voltaren gel. An ankle brace is used occasionally, particularly during long walks such as the [place]. Wearing shoes with a higher heel provides some relief.  Her daily activities include walking five miles on Mondays, Wednesdays, and Fridays, and playing golf on Tuesdays and Thursdays, covering about four miles. She is concerned about worsening symptoms, referencing a family member with neuropathy.  Current medications include Voltaren gel applied topically to the affected area. She has also tried using an ankle brace and different types of shoes to alleviate symptoms.    Review  of Systems is noted in the HPI, as appropriate  Objective:   BP 120/82   Pulse 73   Temp 98.5 F (36.9 C) (Temporal)   Ht 5' 1.75 (1.568 m)   Wt 174 lb 4 oz (79 kg)   LMP 02/15/2002   SpO2 96%   BMI 32.13 kg/m   GEN: No acute distress; alert,appropriate. PULM: Breathing comfortably in no respiratory distress PSYCH: Normally interactive.    Foot: b Echymosis: no Edema: no ROM: full LE B Gait: heel toe, non-antalgic MT pain: no Callus pattern: none Lateral Mall: NT Medial Mall: NT Talus: NT Navicular: NT Cuboid: NT Calcaneous: NT Metatarsals: NT 5th MT: NT Phalanges: NT Achilles: PAINFUL TO PALPATE AT INSERTION B Plantar Fascia: NT Fat Pad: NT Peroneals: NT Post Tib: NT Great Toe: Nml motion Ant Drawer: neg ATFL: NT CFL: NT Deltoid: NT Sensation: intact  Cavus foot Wide forefoot  Laboratory and Imaging Data:  Assessment and Plan:     ICD-10-CM   1. Achilles tendonitis, bilateral  M76.61    M76.62      Assessment & Plan Bilateral Achilles tendinitis Chronic bilateral Achilles tendinitis with mild pain at tendon insertion. Symptoms align with Achilles tendinitis rather than plantar fasciitis. Current management includes Voltaren and ankle brace with some relief. Proper footwear and rehabilitation exercises emphasized. - Recommend shoes with 12 mm heel lift. - Advise felt heel lifts for additional support. - Instruct on eccentric strengthening exercises for Achilles tendon. - Continue topical Voltaren as needed. - Reassess  in 6 to 8 weeks if symptoms persist or worsen. - Add additional orthopedic felt heel lift  Patient Instructions  Hapad heel lifts - achilles inserts  Achilles Tendon Rehab  Start easy.  It is ok if there is mild discomfort, but if there is pain, then back off how much rehab you are doing.  For achilles rehab, you basically just need to concentrate on the ankle going up and down.  Start: Calf raises while seated First lower  and then raise on both feet Assist lifting with hands and then slowly lower  Begin with 3 sets of 10 repetitions Increase by 5 repetitions every 3 days  Goal is 3 sets of 30 repetitions  If feels good at 3 sets of 30 - add backpack with 5 lbs  Increase by 5 lbs per week to max of 30 lbs   Alternative: If you have weights at home, you can put a dumbbell upright on the knee of the effected heel.  Go up with assistance from your hands, and then lower slowly.  If this is easy, then change to calf lowers standing on a step.  A heel cup of any brand will elevate the heel and take stress off of the achilles tendon. Hapad Heel lifts - achilles inserts I particularly like the brand called Tuli's heel cups.  They cup the back of the heel, too.  They can be hard to find unless you can order off of the computer like on [employer]. Any heel cup is ok, though, including ones you can find at any pharmacy    Medication Management during today's office visit: No orders of the defined types were placed in this encounter.  There are no discontinued medications.  Orders placed today for conditions managed today: No orders of the defined types were placed in this encounter.   Disposition: No follow-ups on file.  [employer] speech-to-text software was used for transcription in this dictation.  Possible transcriptional errors can occur using Animal nutritionist.   Signed,  Jacques DASEN. Scout Guyett, MD   Outpatient Encounter Medications as of 11/30/2023  Medication Sig   calcium carbonate (OS-CAL) 600 MG TABS tablet Take 1 tablet by mouth daily.   Cholecalciferol (VITAMIN D) 50 MCG (2000 UT) CAPS Take 1 capsule by mouth every other day.   diclofenac Sodium (VOLTAREN) 1 % GEL Apply 2 g topically daily as needed.   No facility-administered encounter medications on file as of 11/30/2023.

## 2023-11-30 NOTE — Patient Instructions (Addendum)
 Hapad heel lifts - achilles inserts  Achilles Tendon Rehab  Start easy.  It is ok if there is mild discomfort, but if there is pain, then back off how much rehab you are doing.  For achilles rehab, you basically just need to concentrate on the ankle going up and down.  Start: Calf raises while seated First lower and then raise on both feet Assist lifting with hands and then slowly lower  Begin with 3 sets of 10 repetitions Increase by 5 repetitions every 3 days  Goal is 3 sets of 30 repetitions  If feels good at 3 sets of 30 - add backpack with 5 lbs  Increase by 5 lbs per week to max of 30 lbs   Alternative: If you have weights at home, you can put a dumbbell upright on the knee of the effected heel.  Go up with assistance from your hands, and then lower slowly.  If this is easy, then change to calf lowers standing on a step.  A heel cup of any brand will elevate the heel and take stress off of the achilles tendon. Hapad Heel lifts - achilles inserts I particularly like the brand called Tuli's heel cups.  They cup the back of the heel, too.  They can be hard to find unless you can order off of the computer like on [employer]. Any heel cup is ok, though, including ones you can find at any pharmacy

## 2023-12-20 DIAGNOSIS — K080 Exfoliation of teeth due to systemic causes: Secondary | ICD-10-CM | POA: Diagnosis not present

## 2024-11-20 ENCOUNTER — Ambulatory Visit

## 2024-11-21 ENCOUNTER — Ambulatory Visit
